# Patient Record
Sex: Male | Born: 1945
Health system: Southern US, Community
[De-identification: ages and names within clinical notes are randomized; demographics above are authoritative.]

## PROBLEM LIST (undated history)

## (undated) DIAGNOSIS — E785 Hyperlipidemia, unspecified: Secondary | ICD-10-CM

## (undated) DIAGNOSIS — J310 Chronic rhinitis: Secondary | ICD-10-CM

## (undated) DIAGNOSIS — Z85828 Personal history of other malignant neoplasm of skin: Secondary | ICD-10-CM

## (undated) HISTORY — PX: MOHS SURGERY: SUR867

## (undated) HISTORY — DX: Hyperlipidemia, unspecified: E78.5

## (undated) HISTORY — DX: Personal history of other malignant neoplasm of skin: Z85.828

## (undated) HISTORY — DX: Chronic rhinitis: J31.0

---

## 2018-07-09 ENCOUNTER — Ambulatory Visit (INDEPENDENT_AMBULATORY_CARE_PROVIDER_SITE_OTHER): Payer: Medicare Other | Admitting: Family Medicine

## 2018-07-09 ENCOUNTER — Other Ambulatory Visit: Payer: Self-pay

## 2018-07-09 ENCOUNTER — Encounter: Payer: Self-pay | Admitting: Family Medicine

## 2018-07-09 DIAGNOSIS — E785 Hyperlipidemia, unspecified: Secondary | ICD-10-CM | POA: Diagnosis not present

## 2018-07-09 DIAGNOSIS — Z85828 Personal history of other malignant neoplasm of skin: Secondary | ICD-10-CM | POA: Diagnosis not present

## 2018-07-09 MED ORDER — SIMVASTATIN 20 MG PO TABS: 20.0000 mg | ORAL_TABLET | Freq: Every day | ORAL | 3 refills | Status: DC

## 2018-07-09 NOTE — Progress Notes (Addendum)
Chief Complaint  Patient presents with  . New Patient (Initial Visit)       New Patient Visit SUBJECTIVE: HPI: Justin Escobar is an 73 y.o.male who is being seen for establishing care.  Seen in [place] previously.   Due to outbreak, we are interacting via telephone. Tried to set up a visit via web portal for an electronic face-to-face visit, but this was not done due to tech difficulties. I verified patient's ID using 2 identifiers.   Hx of skin cancer (BCC) for which he has bi-yearly checks from derm. Requesting referral. No current lesions of concern.  Hx of hyperlipidemia. Takes Zocor 20 mg/d, no AE's. Reports compliance.  Past Medical History:  Diagnosis Date  . History of basal cell carcinoma (BCC) of skin    Past Surgical History:  Procedure Laterality Date  . MOHS SURGERY     Family History  Problem Relation Age of Onset  . Alzheimer's disease Mother   . Cancer Neg Hx    Takes no meds routinely.  ROS Skin: Denies current skin lesions   OBJECTIVE: No conversational dyspnea Age appropriate judgment and insight Nml affect and mood  ASSESSMENT/PLAN: History of skin cancer - Plan: Ambulatory referral to Dermatology  Hyperlipidemia, unspecified hyperlipidemia type - Plan: simvastatin (ZOCOR) 20 MG tablet  Refills as above. Total time spent: 17 minutes Patient should return in 6 mo for med ck and likely will get some labs. The patient voiced understanding and agreement to the plan.   [place], DO 07/09/18  9:25 AM

## 2018-07-14 ENCOUNTER — Telehealth: Payer: Self-pay | Admitting: Family Medicine

## 2018-07-14 MED ORDER — SIMVASTATIN 40 MG PO TABS: 40.0000 mg | ORAL_TABLET | Freq: Every day | ORAL | 6 refills | Status: DC

## 2018-07-14 NOTE — Addendum Note (Signed)
Addended by: Sharon Seller B on: 07/14/2018 02:50 PM   Modules accepted: Orders

## 2018-07-14 NOTE — Telephone Encounter (Signed)
We did refer to derm. I thought he told us 20 mg, OK to send in what dose he was on. Ty.

## 2018-07-14 NOTE — Telephone Encounter (Signed)
Corrected the amount and sent in.

## 2018-07-14 NOTE — Telephone Encounter (Signed)
Patient called and stated that the wrong dose was called in for his simvastatin. He would like a call back about this and to discuss his referral to dermatology that was placed on 07/09/2018. Call back number is [phone] (mobile)

## 2018-11-07 ENCOUNTER — Ambulatory Visit (INDEPENDENT_AMBULATORY_CARE_PROVIDER_SITE_OTHER): Payer: Medicare Other | Admitting: Family Medicine

## 2018-11-07 ENCOUNTER — Other Ambulatory Visit: Payer: Self-pay

## 2018-11-07 ENCOUNTER — Encounter: Payer: Self-pay | Admitting: Family Medicine

## 2018-11-07 VITALS — BP 134/86 | HR 79 | Temp 98.0°F | Ht 73.0 in | Wt 190.1 lb

## 2018-11-07 DIAGNOSIS — N50811 Right testicular pain: Secondary | ICD-10-CM | POA: Diagnosis not present

## 2018-11-07 DIAGNOSIS — N509 Disorder of male genital organs, unspecified: Secondary | ICD-10-CM

## 2018-11-07 NOTE — Patient Instructions (Addendum)
If you do not hear anything about your referral in the next 1-2 weeks, call our office and ask for an update.  Ice/cold pack over area for 10-15 min twice daily.  OK to take Tylenol 1000 mg (2 extra strength tabs) or 975 mg (3 regular strength tabs) every 6 hours as needed.  Let us know if you need anything.  

## 2018-11-07 NOTE — Progress Notes (Signed)
Chief Complaint  Patient presents with  . Testicle Pain  . Referral    Subjective: Patient is a 73 y.o. male here for testicular pain.  R sided testicular pain that started around 3 mo ago. No recent injury or change in activity. Happened around 1 year ago before moving, had [name] that showed cyst but no other issues. It had gone away randomly. Comes and goes in general. Wears briefs. Walks, does not run or cycle. No hx of antibiotics.   ROS: GU: As noted in HPI  Past Medical History:  Diagnosis Date  . History of basal cell carcinoma (BCC) of skin     Objective: BP 134/86 (BP Location: Left Arm, Patient Position: Sitting, Cuff Size: Normal)   Pulse 79   Temp 98 F (36.7 C) (Temporal)   Ht 6\' 1"  (1.854 m)   Wt 190 lb 2 oz (86.2 kg)   SpO2 97%   BMI 25.08 kg/m  General: Awake, appears stated age GU: Mild ttp over epididymis. No current pain. On the scrotum, there is a dome shaped lesion approximately 1.5 cm in diameter, there is a central excoriation. No other erythema or fluctuance.  Heart: RRR Abd: S, NT, ND, BS+ Lungs: CTAB, no rales, wheezes or rhonchi. No accessory muscle use Psych: Age appropriate judgment and insight, normal affect and mood  Assessment and Plan: Testicular pain, right - Plan: Ambulatory referral to Urology, question epididymitis.   Scrotal skin lesion - Plan: Ambulatory referral to Urology, concern for [facility], will biopsy if urology defers.   The patient voiced understanding and agreement to the plan.  [place], DO 11/07/18  3:23 PM

## 2019-01-12 ENCOUNTER — Other Ambulatory Visit: Payer: Self-pay

## 2019-01-13 ENCOUNTER — Ambulatory Visit (INDEPENDENT_AMBULATORY_CARE_PROVIDER_SITE_OTHER): Payer: Medicare Other | Admitting: Family Medicine

## 2019-01-13 ENCOUNTER — Encounter: Payer: Self-pay | Admitting: Family Medicine

## 2019-01-13 VITALS — BP 120/72 | HR 83 | Temp 97.1°F | Ht 73.0 in | Wt 189.0 lb

## 2019-01-13 DIAGNOSIS — J3489 Other specified disorders of nose and nasal sinuses: Secondary | ICD-10-CM | POA: Diagnosis not present

## 2019-01-13 DIAGNOSIS — E785 Hyperlipidemia, unspecified: Secondary | ICD-10-CM | POA: Diagnosis not present

## 2019-01-13 DIAGNOSIS — Z1159 Encounter for screening for other viral diseases: Secondary | ICD-10-CM

## 2019-01-13 DIAGNOSIS — E538 Deficiency of other specified B group vitamins: Secondary | ICD-10-CM | POA: Insufficient documentation

## 2019-01-13 LAB — COMPREHENSIVE METABOLIC PANEL
ALT: 27 U/L (ref 0–53)
AST: 25 U/L (ref 0–37)
Albumin: 4.9 g/dL (ref 3.5–5.2)
Alkaline Phosphatase: 66 U/L (ref 39–117)
BUN: 13 mg/dL (ref 6–23)
CO2: 30 mEq/L (ref 19–32)
Calcium: 9.8 mg/dL (ref 8.4–10.5)
Chloride: 103 mEq/L (ref 96–112)
Creatinine, Ser: 0.96 mg/dL (ref 0.40–1.50)
GFR: 76.67 mL/min (ref >60.00)
Glucose, Bld: 97 mg/dL (ref 70–99)
Potassium: 4.6 mEq/L (ref 3.5–5.1)
Sodium: 139 mEq/L (ref 135–145)
Total Bilirubin: 0.9 mg/dL (ref 0.2–1.2)
Total Protein: 7 g/dL (ref 6.0–8.3)

## 2019-01-13 LAB — LIPID PANEL
Cholesterol: 131 mg/dL (ref 0–200)
HDL: 55.2 mg/dL (ref >39.00)
LDL Cholesterol: 51 mg/dL (ref 0–99)
NonHDL: 75.77
Total CHOL/HDL Ratio: 2
Triglycerides: 124 mg/dL (ref 0.0–149.0)
VLDL: 24.8 mg/dL (ref 0.0–40.0)

## 2019-01-13 LAB — VITAMIN B12: Vitamin B-12: 341 pg/mL (ref 211–911)

## 2019-01-13 MED ORDER — LEVOCETIRIZINE DIHYDROCHLORIDE 5 MG PO TABS: 5.0000 mg | ORAL_TABLET | Freq: Every evening | ORAL | 2 refills | Status: DC

## 2019-01-13 NOTE — Patient Instructions (Addendum)
Give us 2-3 business days to get the results of your labs back.   Keep the diet clean and stay active.  Claritin (loratadine), Allegra (fexofenadine), Zyrtec (cetirizine) which is also equivalent to Xyzal (levocetirizine); these are listed in order from weakest to strongest. Generic, and therefore cheaper, options are in the parentheses.   Flonase (fluticasone); nasal spray that is over the counter. 2 sprays each nostril, once daily. Aim towards the same side eye when you spray.  There are available OTC, and the generic versions, which may be cheaper, are in parentheses. Show this to a pharmacist if you have trouble finding any of these items.  Let us know if you need anything. 

## 2019-01-13 NOTE — Progress Notes (Signed)
Chief Complaint  Patient presents with  . Follow-up    Subjective: Hyperlipidemia Patient presents for Hyperlipidemia follow up. Currently taking simvastatin 40 mg daily and compliance with treatment thus far has been good. He denies myalgias. He is adhering to a healthy diet. Exercise: Walking The patient is not known to have coexisting coronary artery disease.  Has had a runny nose for many years.  Saw an allergist and found out he is not allergic to very much.  Has not tried any medication for this.  Usually on the right side will have a runny nose and teary-eyed.  Denies any itching or swelling.  He has a history of low vitamin B12.  He used to be on oral supplementation but is not currently on anything.  ROS: Heart: Denies chest pain Lungs: Denies SOB   Past Medical History:  Diagnosis Date  . History of basal cell carcinoma (BCC) of skin     Objective: BP 120/72 (BP Location: Left Arm, Patient Position: Sitting, Cuff Size: Normal)   Pulse 83   Temp (!) 97.1 F (36.2 C) (Temporal)   Ht 6\' 1"  (1.854 m)   Wt 189 lb (85.7 kg)   SpO2 96%   BMI 24.94 kg/m  General: Awake, appears stated age HEENT: MMM, nares patent w no current rhinorrhea, ears neg, eyes clear without drainage or crusting Heart: RRR, no LE edema, no bruits Lungs: CTAB, no rales, wheezes or rhonchi. No accessory muscle use Psych: Age appropriate judgment and insight, normal affect and mood  Assessment and Plan: Hyperlipidemia, unspecified hyperlipidemia type - Plan: Lipid panel, Comprehensive metabolic panel  Low serum vitamin B12 - Plan: B12  Encounter for hepatitis C screening test for low risk patient - Plan: Hepatitis C antibody  Rhinorrhea - Plan: levocetirizine (XYZAL) 5 MG tablet  1-check labs, continue Zocor, counseled on diet and exercise 2-check B12 level, might not need to take any supplementation 3-check 4-trial oral antihistamine. F/u in 1 year for med check. The patient voiced  understanding and agreement to the plan.  [place], DO 01/13/19  12:07 PM

## 2019-01-14 LAB — HEPATITIS C ANTIBODY
Hepatitis C Ab: NONREACTIVE
SIGNAL TO CUT-OFF: 0.01 (ref <1.00)

## 2019-03-05 ENCOUNTER — Telehealth: Payer: Self-pay | Admitting: Family Medicine

## 2019-03-05 NOTE — Telephone Encounter (Signed)
Has patient seen PCP for this complaint? No and unsure *If NO, is insurance requiring patient see PCP for this issue before PCP can refer them? Referral for which specialty: Podiatrist Preferred provider/office: office on [address], pt unsure of office.  Reason for referral: pt states left foot has been causing him pain to walk.

## 2019-03-09 NOTE — Telephone Encounter (Signed)
appt scheduled tomorrow 03/10/19 in the morning.

## 2019-03-09 NOTE — Telephone Encounter (Signed)
Sched him w me tomorrow so I can take a look first. Might not need referral. Ty.

## 2019-03-10 ENCOUNTER — Other Ambulatory Visit: Payer: Self-pay | Admitting: Family Medicine

## 2019-03-10 ENCOUNTER — Other Ambulatory Visit: Payer: Self-pay

## 2019-03-10 ENCOUNTER — Encounter: Payer: Self-pay | Admitting: Family Medicine

## 2019-03-10 ENCOUNTER — Ambulatory Visit (INDEPENDENT_AMBULATORY_CARE_PROVIDER_SITE_OTHER): Payer: Medicare Other | Admitting: Family Medicine

## 2019-03-10 ENCOUNTER — Ambulatory Visit (HOSPITAL_BASED_OUTPATIENT_CLINIC_OR_DEPARTMENT_OTHER)
Admission: RE | Admit: 2019-03-10 | Discharge: 2019-03-10 | Disposition: A | Discharge status: Home / Self Care | Payer: Medicare Other | Source: Ambulatory Visit | Attending: Family Medicine | Admitting: Family Medicine

## 2019-03-10 VITALS — BP 130/70 | HR 73 | Temp 96.1°F | Ht 73.0 in | Wt 193.0 lb

## 2019-03-10 DIAGNOSIS — M19079 Primary osteoarthritis, unspecified ankle and foot: Secondary | ICD-10-CM

## 2019-03-10 DIAGNOSIS — M79672 Pain in left foot: Secondary | ICD-10-CM | POA: Diagnosis not present

## 2019-03-10 NOTE — Progress Notes (Signed)
Musculoskeletal Exam  Patient: Justin Escobar DOB: 09-04-1945  DOS: 03/10/2019  SUBJECTIVE:  Chief Complaint:   Chief Complaint  Patient presents with  . Foot Pain    left    Justin Escobar is a 73 y.o.  male for evaluation and treatment of L foot pain.   Onset:  3 years ago. Was chasing dog and tripped; had bruising and swelling initially Location: ball of feet Character:  shooting  Progression of issue:  Has gotten worse over past year Associated symptoms: worse when he walks Treatment: to date has been none.   Neurovascular symptoms: no  ROS: Musculoskeletal/Extremities: +L foot pain  Past Medical History:  Diagnosis Date  . History of basal cell carcinoma (BCC) of skin     Objective: VITAL SIGNS: BP 130/70 (BP Location: Left Arm, Patient Position: Sitting, Cuff Size: Normal)   Pulse 73   Temp (!) 96.1 F (35.6 C) (Temporal)   Ht 6\' 1"  (1.854 m)   Wt 193 lb (87.5 kg)   SpO2 97%   BMI 25.46 kg/m  Constitutional: Well formed, well developed. No acute distress. Cardiovascular: Brisk cap refill Thorax & Lungs: No accessory muscle use Musculoskeletal: L foot.   Tenderness to palpation: yes, over 1st plantar MTP and MT head Deformity: no Ecchymosis: no No erythema or excessive warmth Neurologic: Normal sensory function. Psychiatric: Normal mood. Age appropriate judgment and insight. Alert & oriented x 3.    Assessment:  Left foot pain - Plan: DG Toe Great Left  Plan: Ck xr. MT pads. If no improvement, will refer to podiatry for possible injection.  F/u prn. The patient voiced understanding and agreement to the plan.   [place], DO 03/10/19  12:04 PM

## 2019-03-10 NOTE — Patient Instructions (Signed)
Add metatarsal pads to your shoes for walking.   Ice/cold pack over area for 10-15 min twice daily.  Activity as tolerated.  We will be in touch regarding your X-ray results and what the next steps.   Let us know if you need anything.

## 2019-03-16 ENCOUNTER — Other Ambulatory Visit: Payer: Self-pay

## 2019-03-16 ENCOUNTER — Encounter: Payer: Self-pay | Admitting: Orthopedic Surgery

## 2019-03-16 ENCOUNTER — Ambulatory Visit (INDEPENDENT_AMBULATORY_CARE_PROVIDER_SITE_OTHER): Payer: Medicare Other | Admitting: Orthopedic Surgery

## 2019-03-16 DIAGNOSIS — M79672 Pain in left foot: Secondary | ICD-10-CM

## 2019-03-16 NOTE — Progress Notes (Signed)
   Office Visit Note   Patient: Justin Escobar           Date of Birth: 1946-03-06           MRN: [id number] Visit Date: 03/16/2019              Requested by: Shelda Pal, [address] [place],  [place] [address] PCP: Shelda Pal, DO  Chief Complaint  Patient presents with  . Left Ankle - Pain  . Left Foot - Pain      HPI: Pleasant gentleman with a history of left foot great toe pain. No injury. He recently moved here and notices the pain when he is hiking on uneven ground  Assessment & Plan: Visit Diagnoses: No diagnosis found.  Plan: treatment options were discussed with him.  Dr. Sharol Given discussed with him conservative treatment including stiff shoes and doing nothing..  Alternatively he would do quite well with a left first MTP arthrodesis.  The surgery recovery and expectations and risks were discussed with him he would like to schedule this for January  Follow-Up Instructions: No follow-ups on file.   Ortho Exam  Patient is alert, oriented, no adenopathy, well-dressed, normal affect, normal respiratory effort. Left foot: Well-maintained alignment no swelling he has only 5 degrees of extension and about 10 degrees of flexion of the first MTP joint Previous x-rays were reviewed which shows loss and collapse of joint space of the first MTP joint of the left with associated osteophytes Imaging: No results found. No images are attached to the encounter.  Labs: No results found for: HGBA1C, ESRSEDRATE, CRP, LABURIC, REPTSTATUS, GRAMSTAIN, CULT, LABORGA   Lab Results  Component Value Date   ALBUMIN 4.9 01/13/2019    No results found for: MG No results found for: VD25OH  No results found for: PREALBUMIN No flowsheet data found.   There is no height or weight on file to calculate BMI.  Orders:  No orders of the defined types were placed in this encounter.  No orders of the defined types were placed in this encounter.    Procedures: No procedures performed  Clinical Data: No additional findings.  ROS:  All other systems negative, except as noted in the HPI. Review of Systems  Objective: Vital Signs: There were no vitals taken for this visit.  Specialty Comments:  No specialty comments available.  PMFS History: Patient Active Problem List   Diagnosis Date Noted  . Low serum vitamin B12 01/13/2019  . History of skin cancer 07/09/2018  . Hyperlipidemia 07/09/2018   Past Medical History:  Diagnosis Date  . History of basal cell carcinoma (BCC) of skin     Family History  Problem Relation Age of Onset  . Alzheimer's disease Mother   . Cancer Neg Hx     Past Surgical History:  Procedure Laterality Date  . MOHS SURGERY     Social History   Occupational History  . Not on file  Tobacco Use  . Smoking status: Former Research scientist (life sciences)  . Smokeless tobacco: Never Used  Substance and Sexual Activity  . Alcohol use: Yes  . Drug use: Never  . Sexual activity: Not on file

## 2019-03-30 ENCOUNTER — Other Ambulatory Visit: Payer: Self-pay | Admitting: Family Medicine

## 2019-03-31 DIAGNOSIS — Z85828 Personal history of other malignant neoplasm of skin: Secondary | ICD-10-CM | POA: Diagnosis not present

## 2019-03-31 DIAGNOSIS — D225 Melanocytic nevi of trunk: Secondary | ICD-10-CM | POA: Diagnosis not present

## 2019-03-31 DIAGNOSIS — L570 Actinic keratosis: Secondary | ICD-10-CM | POA: Diagnosis not present

## 2019-04-19 ENCOUNTER — Ambulatory Visit: Payer: Medicare HMO | Attending: Internal Medicine

## 2019-04-19 DIAGNOSIS — Z23 Encounter for immunization: Secondary | ICD-10-CM | POA: Insufficient documentation

## 2019-04-19 NOTE — Progress Notes (Signed)
   Covid-19 Vaccination Clinic  Name:  Justin Escobar    MRN: [id number] DOB: 01-Jul-1945  04/19/2019  [name] was observed post Covid-19 immunization for 15 minutes without incidence. He was provided with Vaccine Information Sheet and instruction to access the V-Safe system.   [name] was instructed to call 911 with any severe reactions post vaccine: Marland Kitchen Difficulty breathing  . Swelling of your face and throat  . A fast heartbeat  . A bad rash all over your body  . Dizziness and weakness    Immunizations Administered    Name Date Dose VIS Date Route   Pfizer COVID-19 Vaccine 04/19/2019 10:52 AM 0.3 mL 03/06/2019 Intramuscular   Manufacturer: [place]   Lot: [id number]   [place]: [id number]

## 2019-04-21 ENCOUNTER — Telehealth: Payer: Self-pay | Admitting: Orthopedic Surgery

## 2019-04-21 NOTE — Telephone Encounter (Signed)
Patient called.   He is scheduled for outpatient surgery on the 16th and has his second round on the vaccine on the 15th. He needs advisement   Call back number: [phone]

## 2019-04-22 NOTE — Telephone Encounter (Signed)
Patient was called and informed that he can still receive his vaccination with no issue and this will not affect his surgery. He also wanted to be informed of getting a boot instead of post-op shoe on the date of discharge and was advised that he needs will be met upon that date. Patient understood. 

## 2019-04-23 ENCOUNTER — Other Ambulatory Visit: Payer: Self-pay

## 2019-04-23 ENCOUNTER — Telehealth: Payer: Self-pay | Admitting: Orthopedic Surgery

## 2019-04-23 NOTE — Telephone Encounter (Signed)
Patient was scheduled for MTP fusion at [facility] on 05-12-19 with Dr. Sharol Given, but was rescheduled to 05-19-19.  He had the 1st Covid vaccine on 04-19-19 and is scheduled to have 2nd Covid vaccine on 05-11-19. He wants to know if this will cause any issue with test results or delaying his surgery.       Pt's cb  [phone]

## 2019-04-23 NOTE — Telephone Encounter (Signed)
I have spoken to patient and ensured him that his vaccine will not affect him from proceeding with his surgery.

## 2019-04-30 ENCOUNTER — Other Ambulatory Visit: Payer: Self-pay | Admitting: Physician Assistant

## 2019-05-01 ENCOUNTER — Other Ambulatory Visit: Payer: Self-pay | Admitting: Family Medicine

## 2019-05-04 ENCOUNTER — Other Ambulatory Visit: Payer: Self-pay | Admitting: Family Medicine

## 2019-05-04 MED ORDER — SIMVASTATIN 40 MG PO TABS: 40.0000 mg | ORAL_TABLET | Freq: Every day | ORAL | 0 refills | Status: DC

## 2019-05-11 ENCOUNTER — Ambulatory Visit: Payer: Medicare HMO | Attending: Internal Medicine

## 2019-05-11 DIAGNOSIS — Z23 Encounter for immunization: Secondary | ICD-10-CM | POA: Insufficient documentation

## 2019-05-11 NOTE — Progress Notes (Signed)
   Covid-19 Vaccination Clinic  Name:  Justin Escobar    MRN: [id number] DOB: 04/11/1945  05/11/2019  Mr. Sweet was observed post Covid-19 immunization for 15 minutes without incidence. He was provided with Vaccine Information Sheet and instruction to access the V-Safe system.   [name] was instructed to call 911 with any severe reactions post vaccine: Marland Kitchen Difficulty breathing  . Swelling of your face and throat  . A fast heartbeat  . A bad rash all over your body  . Dizziness and weakness    Immunizations Administered    Name Date Dose VIS Date Route   Pfizer COVID-19 Vaccine 05/11/2019  8:36 AM 0.3 mL 03/06/2019 Intramuscular   Manufacturer: [place]   Lot: [id number]   [place]: [id number]

## 2019-05-12 ENCOUNTER — Encounter (HOSPITAL_BASED_OUTPATIENT_CLINIC_OR_DEPARTMENT_OTHER): Payer: Self-pay | Admitting: Orthopedic Surgery

## 2019-05-12 ENCOUNTER — Other Ambulatory Visit: Payer: Self-pay

## 2019-05-15 ENCOUNTER — Other Ambulatory Visit (HOSPITAL_COMMUNITY)
Admission: RE | Admit: 2019-05-15 | Discharge: 2019-05-15 | Disposition: A | Discharge status: Home / Self Care | Payer: Medicare HMO | Source: Ambulatory Visit | Attending: Orthopedic Surgery | Admitting: Orthopedic Surgery

## 2019-05-15 DIAGNOSIS — Z01812 Encounter for preprocedural laboratory examination: Secondary | ICD-10-CM | POA: Insufficient documentation

## 2019-05-15 DIAGNOSIS — Z20822 Contact with and (suspected) exposure to covid-19: Secondary | ICD-10-CM | POA: Insufficient documentation

## 2019-05-15 LAB — SARS CORONAVIRUS 2 (TAT 6-24 HRS): SARS Coronavirus 2: NEGATIVE

## 2019-05-15 NOTE — Progress Notes (Signed)

## 2019-05-18 NOTE — Anesthesia Preprocedure Evaluation (Addendum)
Anesthesia Evaluation  Patient identified by MRN, date of birth, ID band Patient awake    Reviewed: Allergy & Precautions, NPO status , Patient's Chart, lab work & pertinent test results  History of Anesthesia Complications Negative for: history of anesthetic complications  Airway Mallampati: II  TM Distance: >3 FB Neck ROM: Full    Dental no notable dental hx.    Pulmonary former smoker,    Pulmonary exam normal        Cardiovascular negative cardio ROS Normal cardiovascular exam     Neuro/Psych negative neurological ROS  negative psych ROS   GI/Hepatic negative GI ROS, Neg liver ROS,   Endo/Other  negative endocrine ROS  Renal/GU negative Renal ROS  negative genitourinary   Musculoskeletal Left Foot Hallux Rigidus   Abdominal   Peds  Hematology negative hematology ROS (+)   Anesthesia Other Findings Day of surgery medications reviewed with patient.  Reproductive/Obstetrics negative OB ROS                            Anesthesia Physical Anesthesia Plan  ASA: I  Anesthesia Plan: Regional and MAC   Post-op Pain Management:    Induction:   PONV Risk Score and Plan: Treatment may vary due to age or medical condition and Propofol infusion  Airway Management Planned: Natural Airway and Simple Face Mask  Additional Equipment: None  Intra-op Plan:   Post-operative Plan:   Informed Consent: I have reviewed the patients History and Physical, chart, labs and discussed the procedure including the risks, benefits and alternatives for the proposed anesthesia with the patient or authorized representative who has indicated his/her understanding and acceptance.       Plan Discussed with: CRNA  Anesthesia Plan Comments:        Anesthesia Quick Evaluation

## 2019-05-19 ENCOUNTER — Other Ambulatory Visit: Payer: Self-pay

## 2019-05-19 ENCOUNTER — Ambulatory Visit (HOSPITAL_BASED_OUTPATIENT_CLINIC_OR_DEPARTMENT_OTHER): Payer: Medicare HMO | Admitting: Anesthesiology

## 2019-05-19 ENCOUNTER — Encounter (HOSPITAL_BASED_OUTPATIENT_CLINIC_OR_DEPARTMENT_OTHER)
Admission: RE | Disposition: A | Discharge status: Home / Self Care | Payer: Self-pay | Source: Ambulatory Visit | Attending: Orthopedic Surgery

## 2019-05-19 ENCOUNTER — Telehealth: Payer: Self-pay

## 2019-05-19 ENCOUNTER — Telehealth: Payer: Self-pay | Admitting: Orthopedic Surgery

## 2019-05-19 ENCOUNTER — Encounter (HOSPITAL_BASED_OUTPATIENT_CLINIC_OR_DEPARTMENT_OTHER): Payer: Self-pay | Admitting: Orthopedic Surgery

## 2019-05-19 ENCOUNTER — Ambulatory Visit (HOSPITAL_BASED_OUTPATIENT_CLINIC_OR_DEPARTMENT_OTHER)
Admission: RE | Admit: 2019-05-19 | Discharge: 2019-05-19 | Disposition: A | Discharge status: Home / Self Care | Payer: Medicare HMO | Source: Ambulatory Visit | Attending: Orthopedic Surgery | Admitting: Orthopedic Surgery

## 2019-05-19 DIAGNOSIS — Z79899 Other long term (current) drug therapy: Secondary | ICD-10-CM | POA: Insufficient documentation

## 2019-05-19 DIAGNOSIS — Z91040 Latex allergy status: Secondary | ICD-10-CM | POA: Insufficient documentation

## 2019-05-19 DIAGNOSIS — Z85828 Personal history of other malignant neoplasm of skin: Secondary | ICD-10-CM | POA: Diagnosis not present

## 2019-05-19 DIAGNOSIS — M2022 Hallux rigidus, left foot: Secondary | ICD-10-CM | POA: Diagnosis not present

## 2019-05-19 DIAGNOSIS — Z87891 Personal history of nicotine dependence: Secondary | ICD-10-CM | POA: Diagnosis not present

## 2019-05-19 DIAGNOSIS — Z820 Family history of epilepsy and other diseases of the nervous system: Secondary | ICD-10-CM | POA: Diagnosis not present

## 2019-05-19 DIAGNOSIS — E785 Hyperlipidemia, unspecified: Secondary | ICD-10-CM | POA: Diagnosis not present

## 2019-05-19 HISTORY — PX: ARTHRODESIS METATARSALPHALANGEAL JOINT (MTPJ): SHX6566

## 2019-05-19 SURGERY — FUSION, JOINT, GREAT TOE
Anesthesia: Monitor Anesthesia Care | Site: Toe | Laterality: Left

## 2019-05-19 MED ORDER — FENTANYL CITRATE (PF) 100 MCG/2ML IJ SOLN: 25.0000 ug | INTRAMUSCULAR | Status: DC | PRN

## 2019-05-19 MED ORDER — FENTANYL CITRATE (PF) 100 MCG/2ML IJ SOLN
50.0000 ug | INTRAMUSCULAR | Status: DC | PRN
  Administered 2019-05-19: 100 ug via INTRAVENOUS

## 2019-05-19 MED ORDER — BUPIVACAINE HCL 0.5 % IJ SOLN
INTRAMUSCULAR | Status: DC | PRN
  Administered 2019-05-19: 28 mL

## 2019-05-19 MED ORDER — ONDANSETRON HCL 4 MG/2ML IJ SOLN
INTRAMUSCULAR | Status: DC | PRN
  Administered 2019-05-19: 4 mg via INTRAVENOUS

## 2019-05-19 MED ORDER — ACETAMINOPHEN 500 MG PO TABS
ORAL_TABLET | ORAL | Status: AC
  Filled 2019-05-19: qty 2

## 2019-05-19 MED ORDER — ACETAMINOPHEN 500 MG PO TABS
1000.0000 mg | ORAL_TABLET | Freq: Once | ORAL | Status: AC
  Administered 2019-05-19: 08:00:00 1000 mg via ORAL

## 2019-05-19 MED ORDER — HYDROCODONE-ACETAMINOPHEN 5-325 MG PO TABS: 1.0000 | ORAL_TABLET | ORAL | 0 refills | Status: DC | PRN

## 2019-05-19 MED ORDER — PROMETHAZINE HCL 25 MG/ML IJ SOLN: 6.2500 mg | INTRAMUSCULAR | Status: DC | PRN

## 2019-05-19 MED ORDER — CHLORHEXIDINE GLUCONATE 4 % EX LIQD: 60.0000 mL | Freq: Once | CUTANEOUS | Status: DC

## 2019-05-19 MED ORDER — GLYCOPYRROLATE 0.2 MG/ML IJ SOLN
INTRAMUSCULAR | Status: AC
  Filled 2019-05-19: qty 1

## 2019-05-19 MED ORDER — PROPOFOL 10 MG/ML IV BOLUS
INTRAVENOUS | Status: DC | PRN
  Administered 2019-05-19: 20 mg via INTRAVENOUS

## 2019-05-19 MED ORDER — OXYCODONE HCL 5 MG PO TABS: 5.0000 mg | ORAL_TABLET | Freq: Once | ORAL | Status: DC | PRN

## 2019-05-19 MED ORDER — MIDAZOLAM HCL 2 MG/2ML IJ SOLN: 1.0000 mg | INTRAMUSCULAR | Status: DC | PRN

## 2019-05-19 MED ORDER — MIDAZOLAM HCL 2 MG/2ML IJ SOLN
INTRAMUSCULAR | Status: AC
  Filled 2019-05-19: qty 2

## 2019-05-19 MED ORDER — OXYCODONE HCL 5 MG/5ML PO SOLN: 5.0000 mg | Freq: Once | ORAL | Status: DC | PRN

## 2019-05-19 MED ORDER — PROPOFOL 500 MG/50ML IV EMUL
INTRAVENOUS | Status: DC | PRN
  Administered 2019-05-19: 50 ug/kg/min via INTRAVENOUS

## 2019-05-19 MED ORDER — 0.9 % SODIUM CHLORIDE (POUR BTL) OPTIME
TOPICAL | Status: DC | PRN
  Administered 2019-05-19: 1000 mL

## 2019-05-19 MED ORDER — CEFAZOLIN SODIUM-DEXTROSE 2-4 GM/100ML-% IV SOLN
INTRAVENOUS | Status: AC
  Filled 2019-05-19: qty 100

## 2019-05-19 MED ORDER — LACTATED RINGERS IV SOLN: INTRAVENOUS | Status: DC

## 2019-05-19 MED ORDER — GLYCOPYRROLATE 0.2 MG/ML IJ SOLN
0.2000 mg | Freq: Once | INTRAMUSCULAR | Status: AC
  Administered 2019-05-19: 08:00:00 0.2 mg via INTRAVENOUS

## 2019-05-19 MED ORDER — CEFAZOLIN SODIUM-DEXTROSE 2-4 GM/100ML-% IV SOLN
2.0000 g | INTRAVENOUS | Status: AC
  Administered 2019-05-19: 2 g via INTRAVENOUS

## 2019-05-19 MED ORDER — FENTANYL CITRATE (PF) 100 MCG/2ML IJ SOLN
INTRAMUSCULAR | Status: AC
  Filled 2019-05-19: qty 2

## 2019-05-19 SURGICAL SUPPLY — 47 items
BIT DRILL LCP QC 2X140 (BIT) ×1 IMPLANT
BLADE OSC/SAG .038X5.5 CUT EDG (BLADE) ×1 IMPLANT
BLADE SURG 15 STRL LF DISP TIS (BLADE) ×2 IMPLANT
BLADE SURG 15 STRL SS (BLADE) ×2
BNDG COHESIVE 4X5 TAN STRL (GAUZE/BANDAGES/DRESSINGS) ×2 IMPLANT
BNDG ESMARK 4X9 LF (GAUZE/BANDAGES/DRESSINGS) ×2 IMPLANT
BNDG GAUZE ELAST 4 BULKY (GAUZE/BANDAGES/DRESSINGS) ×2 IMPLANT
COVER BACK TABLE 60X90IN (DRAPES) ×2 IMPLANT
COVER WAND RF STERILE (DRAPES) IMPLANT
DECANTER SPIKE VIAL GLASS SM (MISCELLANEOUS) IMPLANT
DRAPE EXTREMITY T 121X128X90 (DISPOSABLE) ×2 IMPLANT
DRAPE OEC MINIVIEW 54X84 (DRAPES) ×2 IMPLANT
DRAPE U-SHAPE 47X51 STRL (DRAPES) IMPLANT
DRSG EMULSION OIL 3X3 NADH (GAUZE/BANDAGES/DRESSINGS) ×2 IMPLANT
DURAPREP 26ML APPLICATOR (WOUND CARE) ×2 IMPLANT
ELECT REM PT RETURN 9FT ADLT (ELECTROSURGICAL) ×2
ELECTRODE REM PT RTRN 9FT ADLT (ELECTROSURGICAL) ×1 IMPLANT
GAUZE 4X4 16PLY RFD (DISPOSABLE) IMPLANT
GAUZE SPONGE 4X4 12PLY STRL (GAUZE/BANDAGES/DRESSINGS) ×2 IMPLANT
GLOVE BIOGEL PI IND STRL 9 (GLOVE) ×1 IMPLANT
GLOVE BIOGEL PI INDICATOR 9 (GLOVE) ×1
GLOVE SURG ORTHO 9.0 STRL STRW (GLOVE) ×2 IMPLANT
GOWN STRL REUS W/ TWL LRG LVL3 (GOWN DISPOSABLE) ×1 IMPLANT
GOWN STRL REUS W/TWL LRG LVL3 (GOWN DISPOSABLE) ×1
GOWN STRL REUS W/TWL XL LVL3 (GOWN DISPOSABLE) ×2 IMPLANT
NDL HYPO 25X1 1.5 SAFETY (NEEDLE) IMPLANT
NEEDLE HYPO 25X1 1.5 SAFETY (NEEDLE) IMPLANT
NS IRRIG 1000ML POUR BTL (IV SOLUTION) ×2 IMPLANT
PACK BASIN DAY SURGERY FS (CUSTOM PROCEDURE TRAY) ×2 IMPLANT
PAD CAST 4YDX4 CTTN HI CHSV (CAST SUPPLIES) ×1 IMPLANT
PADDING CAST COTTON 4X4 STRL (CAST SUPPLIES) ×1
PENCIL SMOKE EVACUATOR (MISCELLANEOUS) IMPLANT
PLATE LOCK VA-LCP 2.7X42 (Plate) ×1 IMPLANT
SCREW CORTEX 2.7 SLF-TPNG 18MM (Screw) ×1 IMPLANT
SCREW LOCK VA ST 2.7X14 (Screw) ×2 IMPLANT
SCREW LOCKING 2.7X16MM VA (Screw) ×3 IMPLANT
SCREW SELF TAP 14MM (Screw) ×1 IMPLANT
SPONGE LAP 18X18 RF (DISPOSABLE) ×2 IMPLANT
STOCKINETTE 6  STRL (DRAPES) ×1
STOCKINETTE 6 STRL (DRAPES) ×1 IMPLANT
SUT ETHILON 2 0 FSLX (SUTURE) ×2 IMPLANT
SUT ETHILON 3 0 FSL (SUTURE) ×2 IMPLANT
SUT VIC AB 2-0 CT1 27 (SUTURE)
SUT VIC AB 2-0 CT1 TAPERPNT 27 (SUTURE) IMPLANT
SYR BULB 3OZ (MISCELLANEOUS) ×2 IMPLANT
SYR CONTROL 10ML LL (SYRINGE) IMPLANT
TOWEL GREEN STERILE FF (TOWEL DISPOSABLE) ×2 IMPLANT

## 2019-05-19 NOTE — Anesthesia Postprocedure Evaluation (Signed)
Anesthesia Post Note  Patient: Justin Escobar  Procedure(s) Performed: LEFT 1ST METATARSALPHALANGEAL JOINT FUSION (Left Toe)     Patient location during evaluation: PACU Anesthesia Type: Regional Level of consciousness: awake and alert and oriented Pain management: pain level controlled Vital Signs Assessment: post-procedure vital signs reviewed and stable Respiratory status: spontaneous breathing, nonlabored ventilation and respiratory function stable Cardiovascular status: blood pressure returned to baseline Postop Assessment: no apparent nausea or vomiting Anesthetic complications: no    Last Vitals:  Vitals:   05/19/19 1000 05/19/19 1015  BP: 104/66 106/73  Pulse: 67 61  Resp: (!) 21 16  Temp:    SpO2: 97% 98%    Last Pain:  Vitals:   05/19/19 1015  PainSc: 0-No pain    LLE Motor Response: Purposeful movement (05/19/19 1015) LLE Sensation: Numbness (05/19/19 1015)          Brennan Bailey

## 2019-05-19 NOTE — Op Note (Signed)
05/19/2019  9:30 AM  PATIENT:  Justin Escobar    PRE-OPERATIVE DIAGNOSIS:  Left Foot Hallux Rigidus  POST-OPERATIVE DIAGNOSIS:  Same  PROCEDURE:  LEFT 1ST METATARSALPHALANGEAL JOINT FUSION C-arm fluoroscopy to verify alignment.  SURGEON:  Newt Minion, MD  PHYSICIAN ASSISTANT:None ANESTHESIA:   General  PREOPERATIVE INDICATIONS:  [name] is a  74 y.o. male with a diagnosis of Left Foot Hallux Rigidus who failed conservative measures and elected for surgical management.    The risks benefits and alternatives were discussed with the patient preoperatively including but not limited to the risks of infection, bleeding, nerve injury, cardiopulmonary complications, the need for revision surgery, among others, and the patient was willing to proceed.  OPERATIVE IMPLANTS: 5 degrees Synthes fusion plate  <[id number]>_0<\/[id number]>_7 @  OPERATIVE FINDINGS: Hard sclerotic bone with large osteophytic bone spurs  OPERATIVE PROCEDURE: Patient was brought the operating room after undergoing an ankle block he then underwent a MAC anesthetic.  After adequate levels anesthesia were obtained patient's left lower extremity was prepped using DuraPrep draped into a sterile field a timeout was called.  A medial longitudinal incision was made over the MTP joint.  This was carried down through the retinaculum and the retinaculum was elevated.  There is large osteophytic bone spurs around the joint that were resected with a saw and rondure.  A guidewire was inserted down the shaft of the head of the first metatarsal and a 20 mm cup reamer was used to ream back to cancellous bone.  The saw and rondure were used to further debride around the metatarsal head.  Guidewire was then inserted down the shaft of the proximal phalanx.  The cone reamer 20 mm was used to ream back to bleeding viable subchondral bone.  Again there was sclerotic bone and this was scored to promote further bleeding.  The wound was irrigated with normal saline  joint was reduced the dorsal plate was applied this was secured proximally with a compression screw compressed a compression screw was placed distally.  2 locking screws were then placed proximally and the 2 compression screws were placed distally the compression screw was removed distally and replaced with a locking screw.  Wound was irrigated with normal saline C-arm fluoroscopy was used to verify alignment of both AP and lateral planes.  The wound was irrigated with normal saline the retinaculum was closed using 2-0 Vicryl skin was closed using 2-0 nylon sterile dressing was applied patient was taken the PACU in stable condition   DISCHARGE PLANNING:  Antibiotic duration: Preoperative antibiotics  Weightbearing: Nonweightbearing postoperative shoe  Pain medication: Vicodin  Dressing care/ Wound VAC: Follow-up in 1 week to change the dressing  Ambulatory devices:crutches  Discharge to: home  Follow-up: In the office 1 week post operative.

## 2019-05-19 NOTE — Discharge Instructions (Signed)
No Tylenol until 1:30 PM on 05/19/2019.     Post Anesthesia Home Care Instructions  Activity: Get plenty of rest for the remainder of the day. A responsible individual must stay with you for 24 hours following the procedure.  For the next 24 hours, DO NOT: -Drive a car -Paediatric nurse -Drink alcoholic beverages -Take any medication unless instructed by your physician -Make any legal decisions or sign important papers.  Meals: Start with liquid foods such as gelatin or soup. Progress to regular foods as tolerated. Avoid greasy, spicy, heavy foods. If nausea and/or vomiting occur, drink only clear liquids until the nausea and/or vomiting subsides. Call your physician if vomiting continues.  Special Instructions/Symptoms: Your throat may feel dry or sore from the anesthesia or the breathing tube placed in your throat during surgery. If this causes discomfort, gargle with warm salt water. The discomfort should disappear within 24 hours.  If you had a scopolamine patch placed behind your ear for the management of post- operative nausea and/or vomiting:  1. The medication in the patch is effective for 72 hours, after which it should be removed.  Wrap patch in a tissue and discard in the trash. Wash hands thoroughly with soap and water. 2. You may remove the patch earlier than 72 hours if you experience unpleasant side effects which may include dry mouth, dizziness or visual disturbances. 3. Avoid touching the patch. Wash your hands with soap and water after contact with the patch.        Regional Anesthesia Blocks  1. Numbness or the inability to move the "blocked" extremity may last from 3-48 hours after placement. The length of time depends on the medication injected and your individual response to the medication. If the numbness is not going away after 48 hours, call your surgeon.  2. The extremity that is blocked will need to be protected until the numbness is gone and the   Strength has returned. Because you cannot feel it, you will need to take extra care to avoid injury. Because it may be weak, you may have difficulty moving it or using it. You may not know what position it is in without looking at it while the block is in effect.  3. For blocks in the legs and feet, returning to weight bearing and walking needs to be done carefully. You will need to wait until the numbness is entirely gone and the strength has returned. You should be able to move your leg and foot normally before you try and bear weight or walk. You will need someone to be with you when you first try to ensure you do not fall and possibly risk injury.  4. Bruising and tenderness at the needle site are common side effects and will resolve in a few days.  5. Persistent numbness or new problems with movement should be communicated to the surgeon or the [place] [phone] [place] [phone]).

## 2019-05-19 NOTE — H&P (Signed)
Justin Escobar is an 74 y.o. male.   Chief Complaint: Pain left great toe MTP joint. HPI: Patient is a 74 year old gentleman who has pain with activities of daily living left great toe MTP joint.  Patient has tried conservative treatment including stiff soled shoes without relief.  Past Medical History:  Diagnosis Date  . History of basal cell carcinoma (BCC) of skin     Past Surgical History:  Procedure Laterality Date  . MOHS SURGERY      Family History  Problem Relation Age of Onset  . Alzheimer's disease Mother   . Cancer Neg Hx    Social History:  reports that he has quit smoking. He has never used smokeless tobacco. He reports current alcohol use. He reports that he does not use drugs.  Allergies:  Allergies  Allergen Reactions  . Latex Rash    Medications Prior to Admission  Medication Sig Dispense Refill  . simvastatin (ZOCOR) 40 MG tablet Take 1 tablet (40 mg total) by mouth daily. 90 tablet 0    No results found for this or any previous visit (from the past 48 hour(s)). No results found.  Review of Systems  All other systems reviewed and are negative.   Height 6\' 1"  (1.854 m), weight 84.8 kg. Physical Exam  Patient is alert and oriented no adenopathy well-dressed normal affect normal respiratory effort.  Examination left foot patient has a good dorsalis pedis pulse.  Patient only has 5 degrees of extension and 10 degrees of flexion of the great toe MTP joint there is pain to palpation over the joint pain with attempted range of motion.  Radiographs were reviewed which shows collapse of the joint space to the MTP joint with dorsal osteophytic bone spurs. Assessment/Plan Assessment: Hallux rigidus left great toe MTP joint.  Plan.  We will plan for left great toe MTP fusion.  Risk and benefits were discussed including infection neurovascular injury nonhealing the bone nonhealing the skin need for additional surgery.  Patient states he understands wished to proceed  at this time.  Newt Minion, MD 05/19/2019, 7:02 AM

## 2019-05-19 NOTE — Telephone Encounter (Signed)
[facility] called said they could not fill rx for norco written for 1-2 po q 4 hrs that the system would only let them fill for 1 po q 4.  I advised ok to change from 1-2 to 1

## 2019-05-19 NOTE — Telephone Encounter (Signed)
Noted  

## 2019-05-19 NOTE — Telephone Encounter (Signed)
Pharm has been called see other message.

## 2019-05-19 NOTE — Anesthesia Procedure Notes (Signed)
Anesthesia Regional Block: Ankle block   Pre-Anesthetic Checklist: ,, timeout performed, Correct Patient, Correct Site, Correct Laterality, Correct Procedure, Correct Position, site marked, Risks and benefits discussed, pre-op evaluation,  At surgeon's request and post-op pain management  Laterality: Left  Prep: Maximum Sterile Barrier Precautions used, chloraprep       Needles:  Injection technique: Single-shot  Needle Type: Echogenic Needle     Needle Length: 4cm  Needle Gauge: 25     Additional Needles:   Narrative:  Start time: 05/19/2019 7:56 AM End time: 05/19/2019 8:00 AM  Performed by: Personally  Anesthesiologist: Brennan Bailey, MD  Additional Notes: Risks, benefits, and alternative discussed. Patient gave consent for procedure. Patient prepped and draped in sterile fashion. Sedation administered, patient remains easily responsive to voice. Local anesthetic given in 5cc increments with no signs or symptoms of intravascular injection. No pain or paraesthesias with injection. Patient monitored throughout procedure with signs of LAST or immediate complications. Tolerated well.   Tawny Asal, MD

## 2019-05-19 NOTE — Telephone Encounter (Signed)
[facility] called and stated that they need to speak with someone right away about this patients pain medication  Please call them asap..  They are closed from 1-2

## 2019-05-19 NOTE — Progress Notes (Signed)
Assisted Dr. Daiva Huge with left, ultrasound guided, ankle block. Side rails up, monitors on throughout procedure. See vital signs in flow sheet. Tolerated Procedure well.

## 2019-05-19 NOTE — Transfer of Care (Signed)
Immediate Anesthesia Transfer of Care Note  Patient: Justin Escobar  Procedure(s) Performed: LEFT 1ST METATARSALPHALANGEAL JOINT FUSION (Left Toe)  Patient Location: PACU  Anesthesia Type:MAC and Regional  Level of Consciousness: awake, alert  and oriented  Airway & Oxygen Therapy: Patient Spontanous Breathing  Post-op Assessment: Report given to RN and Post -op Vital signs reviewed and stable  Post vital signs: Reviewed and stable  Last Vitals:  Vitals Value Taken Time  BP 97/71 05/19/19 0926  Temp    Pulse 64 05/19/19 0929  Resp 18 05/19/19 0929  SpO2 98 % 05/19/19 0929  Vitals shown include unvalidated device data.  Last Pain:  Vitals:   05/19/19 0729  PainSc: 0-No pain      Patients Stated Pain Goal: 3 (81/44/81 8563)  Complications: No apparent anesthesia complications

## 2019-05-21 ENCOUNTER — Encounter: Payer: Self-pay | Admitting: *Deleted

## 2019-05-22 ENCOUNTER — Ambulatory Visit: Payer: Medicare Other

## 2019-05-28 ENCOUNTER — Ambulatory Visit (INDEPENDENT_AMBULATORY_CARE_PROVIDER_SITE_OTHER): Payer: Medicare HMO | Admitting: Orthopedic Surgery

## 2019-05-28 ENCOUNTER — Other Ambulatory Visit: Payer: Self-pay

## 2019-05-28 ENCOUNTER — Encounter: Payer: Self-pay | Admitting: Orthopedic Surgery

## 2019-05-28 VITALS — Ht 73.0 in | Wt 192.0 lb

## 2019-05-28 DIAGNOSIS — M2022 Hallux rigidus, left foot: Secondary | ICD-10-CM

## 2019-05-28 NOTE — Progress Notes (Signed)
   Office Visit Note   Patient: Justin Escobar           Date of Birth: 07-22-1945           MRN: [id number] Visit Date: 05/28/2019              Requested by: Shelda Pal, [address] [place],  [place] [address] PCP: Shelda Pal, DO  Chief Complaint  Patient presents with  . Left Foot - Routine Post Op    05/19/19 left foot 1st MTP joint fusion       HPI: This is a pleasant gentleman who is 1 week status post left first MTP arthrodesis he is doing well without complaints he has been placing weight on his heel only in a postop shoe  Assessment & Plan: Visit Diagnoses: No diagnosis found.  Plan: He will follow up in 1 week at which time sutures could be harvested and radiographs of his left foot should be obtained  Follow-Up Instructions: No follow-ups on file.   Ortho Exam  Patient is alert, oriented, no adenopathy, well-dressed, normal affect, normal respiratory effort. Well-healing surgical incision.  Minimal soft tissue swelling CMS is intact no drainage from the incision healthy wound edges  Imaging: No results found. No images are attached to the encounter.  Labs: No results found for: HGBA1C, ESRSEDRATE, CRP, LABURIC, REPTSTATUS, GRAMSTAIN, CULT, LABORGA   Lab Results  Component Value Date   ALBUMIN 4.9 01/13/2019    No results found for: MG No results found for: VD25OH  No results found for: PREALBUMIN No flowsheet data found.   Body mass index is 25.33 kg/m.  Orders:  No orders of the defined types were placed in this encounter.  No orders of the defined types were placed in this encounter.    Procedures: No procedures performed  Clinical Data: No additional findings.  ROS:  All other systems negative, except as noted in the HPI. Review of Systems  Objective: Vital Signs: Ht 6\' 1"  (1.854 m)   Wt 192 lb (87.1 kg)   BMI 25.33 kg/m   Specialty Comments:  No specialty comments  available.  PMFS History: Patient Active Problem List   Diagnosis Date Noted  . Hallux rigidus, left foot   . Low serum vitamin B12 01/13/2019  . History of skin cancer 07/09/2018  . Hyperlipidemia 07/09/2018   Past Medical History:  Diagnosis Date  . History of basal cell carcinoma (BCC) of skin     Family History  Problem Relation Age of Onset  . Alzheimer's disease Mother   . Cancer Neg Hx     Past Surgical History:  Procedure Laterality Date  . ARTHRODESIS METATARSALPHALANGEAL JOINT (MTPJ) Left 05/19/2019   Procedure: LEFT 1ST METATARSALPHALANGEAL JOINT FUSION;  Surgeon: Newt Minion, MD;  Location: [facility];  Service: Orthopedics;  Laterality: Left;  . MOHS SURGERY     Social History   Occupational History  . Not on file  Tobacco Use  . Smoking status: Former Research scientist (life sciences)  . Smokeless tobacco: Never Used  Substance and Sexual Activity  . Alcohol use: Yes    Comment: 6-7 drinks per week  . Drug use: Never  . Sexual activity: Not on file

## 2019-06-04 ENCOUNTER — Other Ambulatory Visit: Payer: Self-pay

## 2019-06-04 ENCOUNTER — Ambulatory Visit (INDEPENDENT_AMBULATORY_CARE_PROVIDER_SITE_OTHER): Payer: Medicare HMO | Admitting: Orthopedic Surgery

## 2019-06-04 ENCOUNTER — Encounter: Payer: Self-pay | Admitting: Orthopedic Surgery

## 2019-06-04 ENCOUNTER — Ambulatory Visit (INDEPENDENT_AMBULATORY_CARE_PROVIDER_SITE_OTHER): Payer: Medicare HMO

## 2019-06-04 VITALS — Ht 73.0 in | Wt 192.0 lb

## 2019-06-04 DIAGNOSIS — M2022 Hallux rigidus, left foot: Secondary | ICD-10-CM | POA: Diagnosis not present

## 2019-06-04 NOTE — Progress Notes (Signed)
   Office Visit Note   Patient: Justin Escobar           Date of Birth: [birth year]           MRN: [id number] Visit Date: 06/04/2019              Requested by: Shelda Pal, [address] [place],  [place] [address] PCP: Shelda Pal, DO  Chief Complaint  Patient presents with  . Left Foot - Routine Post Op    05/19/19 left foot 1st MTP joint fusion       HPI: This is a pleasant gentleman who is almost 3 weeks status post left first MTP arthrodesis.  He is doing well.  He has been nonweightbearing in his postop shoe he is without complaints  Assessment & Plan: Visit Diagnoses:  1. Hallux rigidus, left foot     Plan: He may begin weightbearing as tolerated in the postop shoe at the 4-week mark.  He will follow-up in 4 more weeks x-ray should be obtained at that time  Follow-Up Instructions: No follow-ups on file.   Ortho Exam  Patient is alert, oriented, no adenopathy, well-dressed, normal affect, normal respiratory effort. Focused examination of his foot demonstrates healed surgical incisions surgical sutures are in place mild amount of soft tissue swelling CMS is intact no surrounding erythema no drainage  Imaging: No results found. No images are attached to the encounter.  Labs: No results found for: HGBA1C, ESRSEDRATE, CRP, LABURIC, REPTSTATUS, GRAMSTAIN, CULT, LABORGA   Lab Results  Component Value Date   ALBUMIN 4.9 01/13/2019    No results found for: MG No results found for: VD25OH  No results found for: PREALBUMIN No flowsheet data found.   Body mass index is 25.33 kg/m.  Orders:  Orders Placed This Encounter  Procedures  . XR Foot 2 Views Left   No orders of the defined types were placed in this encounter.    Procedures: No procedures performed  Clinical Data: No additional findings.  ROS:  All other systems negative, except as noted in the HPI. Review of Systems  Objective: Vital Signs: Ht 6\' 1"   (1.854 m)   Wt 192 lb (87.1 kg)   BMI 25.33 kg/m   Specialty Comments:  No specialty comments available.  PMFS History: Patient Active Problem List   Diagnosis Date Noted  . Hallux rigidus, left foot   . Low serum vitamin B12 01/13/2019  . History of skin cancer 07/09/2018  . Hyperlipidemia 07/09/2018   Past Medical History:  Diagnosis Date  . History of basal cell carcinoma (BCC) of skin     Family History  Problem Relation Age of Onset  . Alzheimer's disease Mother   . Cancer Neg Hx     Past Surgical History:  Procedure Laterality Date  . ARTHRODESIS METATARSALPHALANGEAL JOINT (MTPJ) Left 05/19/2019   Procedure: LEFT 1ST METATARSALPHALANGEAL JOINT FUSION;  Surgeon: Newt Minion, MD;  Location: [facility];  Service: Orthopedics;  Laterality: Left;  . MOHS SURGERY     Social History   Occupational History  . Not on file  Tobacco Use  . Smoking status: Former Research scientist (life sciences)  . Smokeless tobacco: Never Used  Substance and Sexual Activity  . Alcohol use: Yes    Comment: 6-7 drinks per week  . Drug use: Never  . Sexual activity: Not on file

## 2019-07-02 ENCOUNTER — Encounter: Payer: Self-pay | Admitting: Orthopedic Surgery

## 2019-07-02 ENCOUNTER — Other Ambulatory Visit: Payer: Self-pay

## 2019-07-02 ENCOUNTER — Ambulatory Visit (INDEPENDENT_AMBULATORY_CARE_PROVIDER_SITE_OTHER): Payer: Medicare HMO

## 2019-07-02 ENCOUNTER — Ambulatory Visit (INDEPENDENT_AMBULATORY_CARE_PROVIDER_SITE_OTHER): Payer: Medicare HMO | Admitting: Orthopedic Surgery

## 2019-07-02 DIAGNOSIS — M2022 Hallux rigidus, left foot: Secondary | ICD-10-CM

## 2019-07-02 NOTE — Progress Notes (Signed)
   Office Visit Note   Patient: Justin Escobar           Date of Birth: 1945-07-27           MRN: [id number] Visit Date: 07/02/2019              Requested by: Shelda Pal, [address] [place],  [place] [address] PCP: Shelda Pal, DO  Chief Complaint  Patient presents with  . Left Foot - Follow-up      HPI: Patient is a 74 year old gentleman who presents about 7 weeks status post left foot MTP fusion of the great toe.  Patient states he has been having some swelling over the IP joint has been ambulating in sandals without problems.  Assessment & Plan: Visit Diagnoses:  1. Hallux rigidus, left foot     Plan: Continue to increase his activities as tolerated follow-up as needed if he develops symptoms.  Follow-Up Instructions: Return if symptoms worsen or fail to improve.   Ortho Exam  Patient is alert, oriented, no adenopathy, well-dressed, normal affect, normal respiratory effort. Examination patient does have some swelling in the left foot there is no redness no cellulitis no tenderness to palpation or attempted distraction of the MTP joint.  There is some swelling of the IP joint.  Imaging: XR Foot Complete Left  Result Date: 07/02/2019 Three-view radiographs of the left foot shows stable MTP fusion no lucency around the screws no complicating features.  No images are attached to the encounter.  Labs: No results found for: HGBA1C, ESRSEDRATE, CRP, LABURIC, REPTSTATUS, GRAMSTAIN, CULT, LABORGA   Lab Results  Component Value Date   ALBUMIN 4.9 01/13/2019    No results found for: MG No results found for: VD25OH  No results found for: PREALBUMIN No flowsheet data found.   There is no height or weight on file to calculate BMI.  Orders:  Orders Placed This Encounter  Procedures  . XR Foot Complete Left   No orders of the defined types were placed in this encounter.    Procedures: No procedures performed  Clinical  Data: No additional findings.  ROS:  All other systems negative, except as noted in the HPI. Review of Systems  Objective: Vital Signs: There were no vitals taken for this visit.  Specialty Comments:  No specialty comments available.  PMFS History: Patient Active Problem List   Diagnosis Date Noted  . Hallux rigidus, left foot   . Low serum vitamin B12 01/13/2019  . History of skin cancer 07/09/2018  . Hyperlipidemia 07/09/2018   Past Medical History:  Diagnosis Date  . History of basal cell carcinoma (BCC) of skin     Family History  Problem Relation Age of Onset  . Alzheimer's disease Mother   . Cancer Neg Hx     Past Surgical History:  Procedure Laterality Date  . ARTHRODESIS METATARSALPHALANGEAL JOINT (MTPJ) Left 05/19/2019   Procedure: LEFT 1ST METATARSALPHALANGEAL JOINT FUSION;  Surgeon: Newt Minion, MD;  Location: [facility];  Service: Orthopedics;  Laterality: Left;  . MOHS SURGERY     Social History   Occupational History  . Not on file  Tobacco Use  . Smoking status: Former Research scientist (life sciences)  . Smokeless tobacco: Never Used  Substance and Sexual Activity  . Alcohol use: Yes    Comment: 6-7 drinks per week  . Drug use: Never  . Sexual activity: Not on file

## 2019-08-02 DIAGNOSIS — J302 Other seasonal allergic rhinitis: Secondary | ICD-10-CM | POA: Diagnosis not present

## 2019-08-02 DIAGNOSIS — J029 Acute pharyngitis, unspecified: Secondary | ICD-10-CM | POA: Diagnosis not present

## 2019-08-04 ENCOUNTER — Telehealth (INDEPENDENT_AMBULATORY_CARE_PROVIDER_SITE_OTHER): Payer: Medicare HMO | Admitting: Family Medicine

## 2019-08-04 ENCOUNTER — Other Ambulatory Visit: Payer: Self-pay

## 2019-08-04 ENCOUNTER — Encounter: Payer: Self-pay | Admitting: Family Medicine

## 2019-08-04 VITALS — Temp 96.6°F

## 2019-08-04 DIAGNOSIS — J069 Acute upper respiratory infection, unspecified: Secondary | ICD-10-CM

## 2019-08-04 MED ORDER — BENZONATATE 100 MG PO CAPS: 100.0000 mg | ORAL_CAPSULE | Freq: Three times a day (TID) | ORAL | 0 refills | Status: DC | PRN

## 2019-08-04 MED ORDER — PREDNISONE 20 MG PO TABS: 40.0000 mg | ORAL_TABLET | Freq: Every day | ORAL | 0 refills | Status: AC

## 2019-08-04 MED ORDER — AZITHROMYCIN 250 MG PO TABS: ORAL_TABLET | ORAL | 0 refills | Status: DC

## 2019-08-04 NOTE — Progress Notes (Signed)
Chief Complaint  Patient presents with  . Cough  . Nasal Congestion    Bonnielee Haff here for URI complaints. Due to COVID-19 pandemic, we are interacting via web portal for an electronic face-to-face visit. I verified patient's ID using 2 identifiers. Patient agreed to proceed with visit via this method. Patient is at home, I am at office. Patient and I are present for visit.   Duration: 4 days  Associated symptoms: sinus congestion, rhinorrhea, sore throat and cough (dry) Denies: sinus pain, itchy watery eyes, ear pain, ear drainage, wheezing, shortness of breath, myalgia and fevers Treatment to date: Zyrtec D and Astelin Sick contacts: No   Past Medical History:  Diagnosis Date  . History of basal cell carcinoma (BCC) of skin     Temp (!) 96.6 F (35.9 C) (Oral)  No conversational dyspnea Age appropriate judgment and insight Nml affect and mood  Viral URI with cough - Plan: benzonatate (TESSALON) 100 MG capsule, predniSONE (DELTASONE) 20 MG tablet  Orders as above. Zpak on Fri if no better.  Continue to push fluids, practice good hand hygiene, cover mouth when coughing. F/u prn. If starting to experience fevers, shaking, or shortness of breath, seek immediate care. Pt voiced understanding and agreement to the plan.  [place], DO 08/04/19 9:46 AM

## 2019-08-17 ENCOUNTER — Ambulatory Visit: Payer: Medicare HMO | Admitting: Family Medicine

## 2019-09-28 ENCOUNTER — Other Ambulatory Visit: Payer: Self-pay | Admitting: Family Medicine

## 2019-10-05 DIAGNOSIS — D225 Melanocytic nevi of trunk: Secondary | ICD-10-CM | POA: Diagnosis not present

## 2019-10-05 DIAGNOSIS — L218 Other seborrheic dermatitis: Secondary | ICD-10-CM | POA: Diagnosis not present

## 2019-10-05 DIAGNOSIS — L821 Other seborrheic keratosis: Secondary | ICD-10-CM | POA: Diagnosis not present

## 2019-10-05 DIAGNOSIS — Z85828 Personal history of other malignant neoplasm of skin: Secondary | ICD-10-CM | POA: Diagnosis not present

## 2019-10-05 DIAGNOSIS — L570 Actinic keratosis: Secondary | ICD-10-CM | POA: Diagnosis not present

## 2019-10-29 DIAGNOSIS — M79672 Pain in left foot: Secondary | ICD-10-CM | POA: Diagnosis not present

## 2019-10-29 DIAGNOSIS — L303 Infective dermatitis: Secondary | ICD-10-CM | POA: Diagnosis not present

## 2019-10-29 DIAGNOSIS — L600 Ingrowing nail: Secondary | ICD-10-CM | POA: Diagnosis not present

## 2019-10-29 DIAGNOSIS — M2042 Other hammer toe(s) (acquired), left foot: Secondary | ICD-10-CM | POA: Diagnosis not present

## 2019-11-19 DIAGNOSIS — R69 Illness, unspecified: Secondary | ICD-10-CM | POA: Diagnosis not present

## 2020-01-04 ENCOUNTER — Other Ambulatory Visit: Payer: Self-pay | Admitting: Family Medicine

## 2020-01-15 ENCOUNTER — Ambulatory Visit: Payer: Medicare Other | Admitting: *Deleted

## 2020-01-15 ENCOUNTER — Encounter: Payer: Self-pay | Admitting: Family Medicine

## 2020-01-15 ENCOUNTER — Ambulatory Visit (INDEPENDENT_AMBULATORY_CARE_PROVIDER_SITE_OTHER): Payer: Medicare HMO | Admitting: Family Medicine

## 2020-01-15 ENCOUNTER — Other Ambulatory Visit: Payer: Self-pay

## 2020-01-15 VITALS — BP 118/76 | HR 97 | Temp 98.0°F | Ht 73.0 in | Wt 172.4 lb

## 2020-01-15 DIAGNOSIS — Z0000 Encounter for general adult medical examination without abnormal findings: Secondary | ICD-10-CM

## 2020-01-15 DIAGNOSIS — E785 Hyperlipidemia, unspecified: Secondary | ICD-10-CM | POA: Diagnosis not present

## 2020-01-15 NOTE — Patient Instructions (Signed)
Give [name] 2-3 business days to get the results of your labs back.   Keep the diet clean and stay active.  Let us know if you need anything.  Heat (pad or rice pillow in microwave) over affected area, 10-15 minutes twice daily.   Ice/cold pack over area for 10-15 min twice daily.  EXERCISES  RANGE OF MOTION (ROM) AND STRETCHING EXERCISES These exercises may help you when beginning to rehabilitate your injury. While completing these exercises, remember:   Restoring tissue flexibility helps normal motion to return to the joints. This allows healthier, less painful movement and activity.  An effective stretch should be held for at least 30 seconds.  A stretch should never be painful. You should only feel a gentle lengthening or release in the stretched tissue.  ROM - Pendulum  Bend at the waist so that your right / left arm falls away from your body. Support yourself with your opposite hand on a solid surface, such as a table or a countertop.  Your right / left arm should be perpendicular to the ground. If it is not perpendicular, you need to lean over farther. Relax the muscles in your right / left arm and shoulder as much as possible.  Gently sway your hips and trunk so they move your right / left arm without any use of your right / left shoulder muscles.  Progress your movements so that your right / left arm moves side to side, then forward and backward, and finally, both clockwise and counterclockwise.  Complete 10-15 repetitions in each direction. Many people use this exercise to relieve discomfort in their shoulder as well as to gain range of motion. Repeat 2 times. Complete this exercise 3 times per week.  STRETCH - Flexion, Standing  Stand with good posture. With an underhand grip on your right / left hand and an overhand grip on the opposite hand, grasp a broomstick or cane so that your hands are a little more than shoulder-width apart.  Keeping your right / left elbow  straight and shoulder muscles relaxed, push the stick with your opposite hand to raise your right / left arm in front of your body and then overhead. Raise your arm until you feel a stretch in your right / left shoulder, but before you have increased shoulder pain.  Try to avoid shrugging your right / left shoulder as your arm rises by keeping your shoulder blade tucked down and toward your mid-back spine. Hold 30 seconds.  Slowly return to the starting position. Repeat 2 times. Complete this exercise 3 times per week.  STRETCH - Internal Rotation  Place your right / left hand behind your back, palm-up.  Throw a towel or belt over your opposite shoulder. Grasp the towel/belt with your right / left hand.  While keeping an upright posture, gently pull up on the towel/belt until you feel a stretch in the front of your right / left shoulder.  Avoid shrugging your right / left shoulder as your arm rises by keeping your shoulder blade tucked down and toward your mid-back spine.  Hold 30. Release the stretch by lowering your opposite hand. Repeat 2 times. Complete this exercise 3 times per week.  STRETCH - External Rotation and Abduction  Stagger your stance through a doorframe. It does not matter which foot is forward.  As instructed by your physician, physical therapist or athletic trainer, place your hands: ? And forearms above your head and on the door frame. ? And forearms at  stomach muscles tight to prevent over-extending your low-back, slowly shift your weight onto your front foot until you feel a stretch across your chest and/or in the front of your shoulders. Hold 30 seconds. Shift your weight to your back foot to release the stretch. Repeat 2 times. Complete this stretch 3 times per week.   STRENGTHENING EXERCISES  These exercises may help you when beginning to rehabilitate your injury. They may  resolve your symptoms with or without further involvement from your physician, physical therapist or athletic trainer. While completing these exercises, remember:  Muscles can gain both the endurance and the strength needed for everyday activities through controlled exercises. Complete these exercises as instructed by your physician, physical therapist or athletic trainer. Progress the resistance and repetitions only as guided. You may experience muscle soreness or fatigue, but the pain or discomfort you are trying to eliminate should never worsen during these exercises. If this pain does worsen, stop and make certain you are following the directions exactly. If the pain is still present after adjustments, discontinue the exercise until you can discuss the trouble with your clinician. If advised by your physician, during your recovery, avoid activity or exercises which involve actions that place your right / left hand or elbow above your head or behind your back or head. These positions stress the tissues which are trying to heal.  STRENGTH - Scapular Depression and Adduction With good posture, sit on a firm chair. Supported your arms in front of you with pillows, arm rests or a table top. Have your elbows in line with the sides of your body. Gently draw your shoulder blades down and toward your mid-back spine. Gradually increase the tension without tensing the muscles along the top of your shoulders and the back of your neck. Hold for 3 seconds. Slowly release the tension and relax your muscles completely before completing the next repetition. After you have practiced this exercise, remove the arm support and complete it in standing as well as sitting. Repeat 2 times. Complete this exercise 3 times per week.   STRENGTH - External Rotators Secure a rubber exercise band/tubing to a fixed object so that it is at the same height as your right / left elbow when you are standing or sitting on a firm  surface. Stand or sit so that the secured exercise band/tubing is at your side that is not injured. Bend your elbow 90 degrees. Place a folded towel or small pillow under your right / left arm so that your elbow is a few inches away from your side. Keeping the tension on the exercise band/tubing, pull it away from your body, as if pivoting on your elbow. Be sure to keep your body steady so that the movement is only coming from your shoulder rotating. Hold 3 seconds. Release the tension in a controlled manner as you return to the starting position. Repeat 2 times. Complete this exercise 3 times per week.   STRENGTH - Supraspinatus Stand or sit with good posture. Grasp a 2-3 lb weight or an exercise band/tubing so that your hand is "thumbs-up," like when you shake hands. Slowly lift your right / left hand from your thigh into the air, traveling about 30 degrees from straight out at your side. Lift your hand to shoulder height or as far as you can without increasing any shoulder pain. Initially, many people do not lift their hands above shoulder height. Avoid shrugging your right / left shoulder as your arm rises by keeping your   shoulder blade tucked down and toward your mid-back spine. Hold for 3 seconds. Control the descent of your hand as you slowly return to your starting position. Repeat 2 times. Complete this exercise 3 times per week.   STRENGTH - Shoulder Extensors Secure a rubber exercise band/tubing so that it is at the height of your shoulders when you are either standing or sitting on a firm arm-less chair. With a thumbs-up grip, grasp an end of the band/tubing in each hand. Straighten your elbows and lift your hands straight in front of you at shoulder height. Step back away from the secured end of band/tubing until it becomes tense. Squeezing your shoulder blades together, pull your hands down to the sides of your thighs. Do not allow your hands to go behind you. Hold for 3 seconds.  Slowly ease the tension on the band/tubing as you reverse the directions and return to the starting position. Repeat 2 times. Complete this exercise 3 times per week.   STRENGTH - Scapular Retractors Secure a rubber exercise band/tubing so that it is at the height of your shoulders when you are either standing or sitting on a firm arm-less chair. With a palm-down grip, grasp an end of the band/tubing in each hand. Straighten your elbows and lift your hands straight in front of you at shoulder height. Step back away from the secured end of band/tubing until it becomes tense. Squeezing your shoulder blades together, draw your elbows back as you bend them. Keep your upper arm lifted away from your body throughout the exercise. Hold 3 seconds. Slowly ease the tension on the band/tubing as you reverse the directions and return to the starting position. Repeat 2 times. Complete this exercise 3 times per week.  STRENGTH - Scapular Depressors Find a sturdy chair without wheels, such as a from a dining room table. Keeping your feet on the floor, lift your bottom from the seat and lock your elbows. Keeping your elbows straight, allow gravity to pull your body weight down. Your shoulders will rise toward your ears. Raise your body against gravity by drawing your shoulder blades down your back, shortening the distance between your shoulders and ears. Although your feet should always maintain contact with the floor, your feet should progressively support less body weight as you get stronger. Hold 3 seconds. In a controlled and slow manner, lower your body weight to begin the next repetition. Repeat 2 times. Complete this exercise 3 times per week.    This information is not intended to replace advice given to you by your health care provider. Make sure you discuss any questions you have with your health care provider.   Document Released: 01/24/2005 Document Revised: 04/02/2014 Document Reviewed:  06/24/2008 Elsevier Interactive Patient Education 2016 [employer]  

## 2020-01-15 NOTE — Progress Notes (Signed)
Chief Complaint  Patient presents with  . Annual Exam    Well Male Justin Escobar is here for a complete physical. His last physical was >1 year ago.  Current diet: in general, a "healthy" diet.   Current exercise: walking Weight trend: Intentionally decreased Fatigue out of ordinary? No. Seat belt? Yes.    Health maintenance Shingrix- Yes Colonoscopy- Yes Tetanus- Yes Hep C- Yes Pneumonia vaccine- Yes  Past Medical History:  Diagnosis Date  . History of basal cell carcinoma (BCC) of skin      Past Surgical History:  Procedure Laterality Date  . ARTHRODESIS METATARSALPHALANGEAL JOINT (MTPJ) Left 05/19/2019   Procedure: LEFT 1ST METATARSALPHALANGEAL JOINT FUSION;  Surgeon: Newt Minion, MD;  Location: [facility];  Service: Orthopedics;  Laterality: Left;  . MOHS SURGERY      Medications  Current Outpatient Medications on File Prior to Visit  Medication Sig Dispense Refill  . simvastatin (ZOCOR) 40 MG tablet Take 1 tablet by mouth once daily 90 tablet 0    Allergies Allergies  Allergen Reactions  . 5-Alpha Reductase Inhibitors   . Latex Rash    Family History Family History  Problem Relation Age of Onset  . Alzheimer's disease Mother   . Cancer Neg Hx    Review of Systems: Constitutional:  no fevers Eye:  no recent significant change in vision Ears:  No changes in hearing Nose/Mouth/Throat:  no complaints of nasal congestion, no sore throat Cardiovascular: no chest pain Respiratory:  No shortness of breath Gastrointestinal:  No change in bowel habits GU:  No frequency Integumentary:  no abnormal skin lesions reported Neurologic:  no headaches Endocrine:  denies unexplained weight changes  Exam BP 118/76 (BP Location: Left Arm, Patient Position: Sitting, Cuff Size: Normal)   Pulse 97   Temp 98 F (36.7 C) (Oral)   Ht 6\' 1"  (1.854 m)   Wt 172 lb 6 oz (78.2 kg)   SpO2 93%   BMI 22.74 kg/m  General:  well developed, well nourished,  in no apparent distress Skin:  no significant moles, warts, or growths Head:  no masses, lesions, or tenderness Eyes:  pupils equal and round, sclera anicteric without injection Ears:  canals without lesions, TMs shiny without retraction, no obvious effusion, no erythema Nose:  nares patent, septum midline, mucosa normal Throat/Pharynx:  lips and gingiva without lesion; tongue and uvula midline; non-inflamed pharynx; no exudates or postnasal drainage Lungs:  clear to auscultation, breath sounds equal bilaterally, no respiratory distress Cardio:  regular rate and rhythm, no LE edema or bruits Rectal: Deferred GI: BS+, S, NT, ND, no masses or organomegaly Musculoskeletal:  symmetrical muscle groups noted without atrophy or deformity Neuro:  gait normal; deep tendon reflexes normal and symmetric Psych: well oriented with normal range of affect and appropriate judgment/insight  Assessment and Plan  Well adult exam  Hyperlipidemia, unspecified hyperlipidemia type - Plan: Lipid panel, Comprehensive metabolic panel, Lipid panel, Comprehensive metabolic panel   Well 74 y.o. male. Counseled on diet and exercise. Other orders as above. He's getting the flu shot in a couple weeks with his wife.  Follow up in 6 mo, labs 1 week prior.  The patient voiced understanding and agreement to the plan.  [place], DO 01/15/20 12:00 PM

## 2020-01-16 LAB — COMPREHENSIVE METABOLIC PANEL
AG Ratio: 1.8 (calc) (ref 1.0–2.5)
ALT: 15 U/L (ref 9–46)
AST: 20 U/L (ref 10–35)
Albumin: 4.4 g/dL (ref 3.6–5.1)
Alkaline phosphatase (APISO): 77 U/L (ref 35–144)
BUN: 17 mg/dL (ref 7–25)
CO2: 29 mmol/L (ref 20–32)
Calcium: 9.4 mg/dL (ref 8.6–10.3)
Chloride: 102 mmol/L (ref 98–110)
Creat: 0.94 mg/dL (ref 0.70–1.18)
Globulin: 2.4 g/dL (calc) (ref 1.9–3.7)
Glucose, Bld: 89 mg/dL (ref 65–99)
Potassium: 4.2 mmol/L (ref 3.5–5.3)
Sodium: 140 mmol/L (ref 135–146)
Total Bilirubin: 0.6 mg/dL (ref 0.2–1.2)
Total Protein: 6.8 g/dL (ref 6.1–8.1)

## 2020-01-16 LAB — LIPID PANEL
Cholesterol: 113 mg/dL (ref <200)
HDL: 56 mg/dL (ref >=40)
LDL Cholesterol (Calc): 42 mg/dL (calc)
Non-HDL Cholesterol (Calc): 57 mg/dL (calc) (ref <130)
Total CHOL/HDL Ratio: 2 (calc) (ref <5.0)
Triglycerides: 69 mg/dL (ref <150)

## 2020-03-10 DIAGNOSIS — R69 Illness, unspecified: Secondary | ICD-10-CM | POA: Diagnosis not present

## 2020-04-07 DIAGNOSIS — D1801 Hemangioma of skin and subcutaneous tissue: Secondary | ICD-10-CM | POA: Diagnosis not present

## 2020-04-07 DIAGNOSIS — L814 Other melanin hyperpigmentation: Secondary | ICD-10-CM | POA: Diagnosis not present

## 2020-04-07 DIAGNOSIS — Z85828 Personal history of other malignant neoplasm of skin: Secondary | ICD-10-CM | POA: Diagnosis not present

## 2020-04-07 DIAGNOSIS — D225 Melanocytic nevi of trunk: Secondary | ICD-10-CM | POA: Diagnosis not present

## 2020-04-07 DIAGNOSIS — L718 Other rosacea: Secondary | ICD-10-CM | POA: Diagnosis not present

## 2020-04-07 DIAGNOSIS — L570 Actinic keratosis: Secondary | ICD-10-CM | POA: Diagnosis not present

## 2020-04-16 ENCOUNTER — Other Ambulatory Visit: Payer: Self-pay | Admitting: Family Medicine

## 2020-04-20 ENCOUNTER — Other Ambulatory Visit: Payer: Self-pay | Admitting: Family Medicine

## 2020-04-22 DIAGNOSIS — L600 Ingrowing nail: Secondary | ICD-10-CM | POA: Diagnosis not present

## 2020-05-06 DIAGNOSIS — B351 Tinea unguium: Secondary | ICD-10-CM | POA: Diagnosis not present

## 2020-05-06 DIAGNOSIS — M79674 Pain in right toe(s): Secondary | ICD-10-CM | POA: Diagnosis not present

## 2020-05-06 DIAGNOSIS — M79675 Pain in left toe(s): Secondary | ICD-10-CM | POA: Diagnosis not present

## 2020-05-06 DIAGNOSIS — L600 Ingrowing nail: Secondary | ICD-10-CM | POA: Diagnosis not present

## 2020-06-06 DIAGNOSIS — M79675 Pain in left toe(s): Secondary | ICD-10-CM | POA: Diagnosis not present

## 2020-06-06 DIAGNOSIS — L600 Ingrowing nail: Secondary | ICD-10-CM | POA: Diagnosis not present

## 2020-06-06 DIAGNOSIS — M79674 Pain in right toe(s): Secondary | ICD-10-CM | POA: Diagnosis not present

## 2020-06-06 DIAGNOSIS — B351 Tinea unguium: Secondary | ICD-10-CM | POA: Diagnosis not present

## 2020-07-04 DIAGNOSIS — M79674 Pain in right toe(s): Secondary | ICD-10-CM | POA: Diagnosis not present

## 2020-07-04 DIAGNOSIS — M79675 Pain in left toe(s): Secondary | ICD-10-CM | POA: Diagnosis not present

## 2020-07-04 DIAGNOSIS — L600 Ingrowing nail: Secondary | ICD-10-CM | POA: Diagnosis not present

## 2020-07-04 DIAGNOSIS — B351 Tinea unguium: Secondary | ICD-10-CM | POA: Diagnosis not present

## 2020-07-07 ENCOUNTER — Telehealth (INDEPENDENT_AMBULATORY_CARE_PROVIDER_SITE_OTHER): Payer: Medicare HMO | Admitting: Family Medicine

## 2020-07-07 DIAGNOSIS — U071 COVID-19: Secondary | ICD-10-CM | POA: Diagnosis not present

## 2020-07-07 DIAGNOSIS — Z20822 Contact with and (suspected) exposure to covid-19: Secondary | ICD-10-CM | POA: Diagnosis not present

## 2020-07-07 MED ORDER — BENZONATATE 100 MG PO CAPS: 100.0000 mg | ORAL_CAPSULE | Freq: Three times a day (TID) | ORAL | 0 refills | Status: DC | PRN

## 2020-07-07 NOTE — Patient Instructions (Signed)
  HOME CARE TIPS:   -I sent the medication(s) we discussed to your pharmacy: Meds ordered this encounter  Medications  . benzonatate (TESSALON PERLES) 100 MG capsule    Sig: Take 1 capsule (100 mg total) by mouth 3 (three) times daily as needed.    Dispense:  20 capsule    Refill:  0     -I sent a message to the outpatient Covid treatment center letting them know that you are interested in treatment. Not all patients referred received treatment based on available treatment and risk factors for severe disease.  -can use tylenol f needed for fevers, aches and pains per instructions  -can use nasal saline a few times per day if you have nasal congestion  -stay hydrated, drink plenty of fluids and eat small healthy meals - avoid dairy  -can take 1000 IU (67mcg) Vit D3 and 100-500 mg of Vit C daily per instructions  -If the Covid test is positive, check out the CDC website for more information on home care, transmission and treatment for COVID19  -follow up with your doctor in 2-3 days unless improving and feeling better  -stay home while sick, except to seek medical care, and if you have COVID19 ideally it would be best to stay home for a full 10 days since the onset of symptoms PLUS one day of no fever and feeling better. Wear a good mask (such as N95 or KN95) if around others to reduce the risk of transmission.  It was nice to meet you today, and I really hope you are feeling better soon. I help [facility] out with telemedicine visits on Tuesdays and Thursdays and am available for visits on those days. If you have any concerns or questions following this visit please schedule a follow up visit with your Primary Care doctor or seek care at a local urgent care clinic to avoid delays in care.    Seek in person care or schedule a follow up video visit promptly if your symptoms worsen, you are having chest pain, difficulty breathing, severe headaches, or feeling very tired, new concerns arise  or you are not improving with treatment. Call 911 and/or seek emergency care if your symptoms are severe or life threatening.

## 2020-07-07 NOTE — Progress Notes (Signed)
Virtual Visit via Telephone Note  I connected with Justin Escobar on 07/07/20 at  3:00 PM EDT by telephone and verified that I am speaking with the correct person using two identifiers.   I discussed the limitations, risks, security and privacy concerns of performing an evaluation and management service by telephone and the availability of in person appointments. I also discussed with the patient that there may be a patient responsible charge related to this service. The patient expressed understanding and agreed to proceed.  Location patient: home, [place] Location provider: work or home office Participants present for the call: patient, provider Patient did not have a visit with me in the prior 7 days to address this/these issue(s).   History of Present Illness:  Acute telemedicine visit for ? covid19 illness: -Onset: yesterday, daughter has covid and he was exposed a few days ago -had + covid test today -Symptoms include: nasal congestion, cough, diarrhea, body aches, fatigue, headaches, fever (high 100.3) -Denies: SOB, CP, vomiting, inability to eat/drink/get out of bed, severe headache -Has tried:musinex -Pertinent past medical history: only risk factor for covid is age -Pertinent medication allergies: latex, 5 alpha reductase inhibitors -COVID-19 vaccine status: 3 doses of pfizer   Observations/Objective: Patient sounds cheerful and well on the phone. I do not appreciate any SOB. Speech and thought processing are grossly intact. Patient reported vitals:  Assessment and Plan:  COVID-19 - Plan: Ambulatory referral for Covid Treatment  -we discussed possible serious and likely etiologies, options for evaluation and workup, limitations of telemedicine visit vs in person visit, treatment, treatment risks and precautions. Pt prefers to treat via telemedicine empirically rather than in person at this moment.  Discussed treatment options, ideal treatment window, potential complications,  isolation and precautions for COVID-19.  He wanted referral for possible Covid treatment because of his age.  Referral placed.  Did let them know I was unsure if treatment would be given without any underlying health problems, however I have seen some treatment given her age at times.  We discussed options for symptomatic care summarized in patient instructions.  He did want a prescription for cough, Tessalon Rx sent.   Scheduled follow up with PCP offered: Agrees to follow-up if needed. Advised to seek prompt in person care if worsening, new symptoms arise, or if is not improving with treatment. Advised of options for inperson care in case PCP office not available. Did let the patient know that I only do telemedicine shifts for [facility] on Tuesdays and Thursdays and advised a follow up visit with PCP or at an [facility] if has further questions or concerns.   Follow Up Instructions:  I did not refer this patient for an OV with me in the next 24 hours for this/these issue(s).  I discussed the assessment and treatment plan with the patient. The patient was provided an opportunity to ask questions and all were answered. The patient agreed with the plan and demonstrated an understanding of the instructions.   I spent 18 minutes on the date of this visit in the care of this patient. See summary of tasks completed to properly care for this patient in the detailed notes above which also included counseling of above, review of PMH, medications, allergies, evaluation of the patient and ordering and/or  instructing patient on testing and care options.     Lucretia Kern, DO

## 2020-07-08 ENCOUNTER — Other Ambulatory Visit (HOSPITAL_COMMUNITY): Payer: Self-pay

## 2020-07-08 ENCOUNTER — Telehealth: Payer: Self-pay | Admitting: Unknown Physician Specialty

## 2020-07-08 ENCOUNTER — Telehealth: Payer: Self-pay

## 2020-07-08 MED ORDER — NIRMATRELVIR/RITONAVIR (PAXLOVID)TABLET
3.0000 | ORAL_TABLET | Freq: Two times a day (BID) | ORAL | 0 refills | Status: AC
  Filled 2020-07-08: qty 30, 5d supply, fill #0

## 2020-07-08 NOTE — Telephone Encounter (Signed)
Outpatient Oral COVID Treatment Note  I connected with Justin Escobar on 07/08/2020/11:49 AM by telephone and verified that I am speaking with the correct person using two identifiers.  I discussed the limitations, risks, security, and privacy concerns of performing an evaluation and management service by telephone and the availability of in person appointments. I also discussed with the patient that there may be a patient responsible charge related to this service. The patient expressed understanding and agreed to proceed.  Patient location: home Provider location: home  Diagnosis: COVID-19 infection  Purpose of visit: Discussion of potential use of Molnupiravir or Paxlovid, a new treatment for mild to moderate COVID-19 viral infection in non-hospitalized patients.   Subjective: Patient is a 75 y.o. male who has been diagnosed with COVID 19 viral infection.  Their symptoms began on 4/13 with cough.    Past Medical History:  Diagnosis Date  . History of basal cell carcinoma (BCC) of skin     Allergies  Allergen Reactions  . 5-Alpha Reductase Inhibitors   . Latex Rash     Current Outpatient Medications:  .  benzonatate (TESSALON PERLES) 100 MG capsule, Take 1 capsule (100 mg total) by mouth 3 (three) times daily as needed., Disp: 20 capsule, Rfl: 0 .  simvastatin (ZOCOR) 40 MG tablet, Take 1 tablet by mouth once daily, Disp: 90 tablet, Rfl: 0  Objective: Patient appears/sounds congested.  They are in no apparent distress.  Breathing is non labored.  Mood and behavior are normal.  Laboratory Data:  No results found for this or any previous visit (from the past 2160 hour(s)).   Assessment: 75 y.o. male with mild/moderate COVID 19 viral infection diagnosed on 4/13 at high risk for progression to severe COVID 19.  Plan:  This patient is a 75 y.o. male that meets the following criteria for Emergency Use Authorization of: Paxlovid 1. Age >12 yr AND > 40 kg 2. SARS-COV-2 positive  test 3. Symptom onset < 5 days 4. Mild-to-moderate COVID disease with high risk for severe progression to hospitalization or death  I have spoken and communicated the following to the patient or parent/caregiver regarding: 1. Paxlovid is an unapproved drug that is authorized for use under an Emergency Use Authorization.  2. There are no adequate, approved, available products for the treatment of COVID-19 in adults who have mild-to-moderate COVID-19 and are at high risk for progressing to severe COVID-19, including hospitalization or death. 3. Other therapeutics are currently authorized. For additional information on all products authorized for treatment or prevention of COVID-19, please see [employer].  4. There are benefits and risks of taking this treatment as outlined in the "Fact Sheet for Patients and Caregivers."  5. "Fact Sheet for Patients and Caregivers" was reviewed with patient. A hard copy will be provided to patient from pharmacy prior to the patient receiving treatment. 6. Patients should continue to self-isolate and use infection control measures (e.g., wear mask, isolate, social distance, avoid sharing personal items, clean and disinfect "high touch" surfaces, and frequent handwashing) according to CDC guidelines.  7. The patient or parent/caregiver has the option to accept or refuse treatment. 8. Patient medication history was reviewed for potential drug interactions:Interaction with home meds: Simvastin 9. Patient's GFR was calculated to be 84, and they were therefore prescribed Normal dose (GFR>60) - nirmatrelvir 150mg  tab (2 tablet) by mouth twice daily AND ritonavir 100mg  tab (1 tablet) by mouth twice daily   After reviewing above information with the patient, the patient agrees to receive  Paxlovid.  Follow up instructions:    . Take prescription BID x 5 days as  directed . Reach out to pharmacist for counseling on medication if desired . For concerns regarding further COVID symptoms please follow up with your PCP or urgent care . For urgent or life-threatening issues, seek care at your local emergency department  The patient was provided an opportunity to ask questions, and all were answered. The patient agreed with the plan and demonstrated an understanding of the instructions.   Script sent to [facility] and opted to pick up RX.  The patient was advised to call their PCP or seek an in-person evaluation if the symptoms worsen or if the condition fails to improve as anticipated.   I provided 20 minutes of non face-to-face telephone visit time during this encounter, and > 50% was spent counseling as documented under my assessment & plan.  Kathrine Haddock, NP 07/08/2020 /11:49 AM

## 2020-07-08 NOTE — Telephone Encounter (Signed)
Called to discuss with patient about COVID-19 symptoms and the use of one of the available treatments for those with mild to moderate Covid symptoms and at a high risk of hospitalization.  Pt appears to qualify for outpatient treatment due to co-morbid conditions and/or a member of an at-risk group in accordance with the FDA Emergency Use Authorization.    Symptom onset: 07/06/20 Cough,diarrhea,body aches, fatigue, headache, fever Vaccinated: Yes Booster? Yes Immunocompromised? No Qualifiers: Age  Pt. Would to speak with APP.   Marcello Moores

## 2020-07-20 ENCOUNTER — Other Ambulatory Visit: Payer: Self-pay | Admitting: Family Medicine

## 2020-08-01 DIAGNOSIS — M79674 Pain in right toe(s): Secondary | ICD-10-CM | POA: Diagnosis not present

## 2020-08-01 DIAGNOSIS — M79675 Pain in left toe(s): Secondary | ICD-10-CM | POA: Diagnosis not present

## 2020-08-01 DIAGNOSIS — B351 Tinea unguium: Secondary | ICD-10-CM | POA: Diagnosis not present

## 2020-08-01 DIAGNOSIS — L600 Ingrowing nail: Secondary | ICD-10-CM | POA: Diagnosis not present

## 2020-08-15 ENCOUNTER — Encounter (HOSPITAL_COMMUNITY): Payer: Self-pay

## 2020-08-15 ENCOUNTER — Other Ambulatory Visit: Payer: Self-pay

## 2020-08-15 ENCOUNTER — Ambulatory Visit (HOSPITAL_COMMUNITY)
Admission: EM | Admit: 2020-08-15 | Discharge: 2020-08-15 | Disposition: A | Discharge status: Home / Self Care | Payer: Medicare HMO | Attending: Family Medicine | Admitting: Family Medicine

## 2020-08-15 DIAGNOSIS — S8991XA Unspecified injury of right lower leg, initial encounter: Secondary | ICD-10-CM | POA: Diagnosis not present

## 2020-08-15 DIAGNOSIS — Z5189 Encounter for other specified aftercare: Secondary | ICD-10-CM

## 2020-08-15 MED ORDER — MUPIROCIN 2 % EX OINT: 1.0000 "application " | TOPICAL_OINTMENT | Freq: Two times a day (BID) | CUTANEOUS | 0 refills | Status: DC

## 2020-08-15 NOTE — ED Provider Notes (Signed)
  [place]   [id number] 08/15/20 Arrival Time: 7353  ASSESSMENT & PLAN:  1. Visit for wound check    No signs of abscess formation.   Meds ordered this encounter  Medications  . mupirocin ointment (BACTROBAN) 2 %    Sig: Apply 1 application topically 2 (two) times daily.    Dispense:  22 g    Refill:  0   Above if he decides to use; otherwise close observation. Redness around wound is likely inflammatory.  May f/u here if worsening.  Reviewed expectations re: course of current medical issues. Questions answered. Outlined signs and symptoms indicating need for more acute intervention. Patient verbalized understanding. After Visit Summary given.   SUBJECTIVE:  Justin Escobar is a 75 y.o. male who presents with a wound to his R anterior lower leg; hit with board approx 2 w ago; feels this is healing well but noticed some redness around wound. No drainage or bleeding. Afebrile.  OBJECTIVE:  Vitals:   08/15/20 1051  BP: (!) 144/86  Pulse: 70  Resp: 18  Temp: 98.4 F (36.9 C)  TempSrc: Oral  SpO2: 97%     General appearance: alert; no distress Skin: anterior lower RLE with healing superficial wound; mild surrounding erythema measuring approx 0.5 cm that is normal skin temperature; no fluctuance bleeding or drainage Psychological: alert and cooperative; normal mood and affect   Allergies  Allergen Reactions  . 5-Alpha Reductase Inhibitors   . Latex Rash    Past Medical History:  Diagnosis Date  . History of basal cell carcinoma (BCC) of skin    Social History   Socioeconomic History  . Marital status: Married    Spouse name: Not on file  . Number of children: Not on file  . Years of education: Not on file  . Highest education level: Not on file  Occupational History  . Not on file  Tobacco Use  . Smoking status: Former Research scientist (life sciences)  . Smokeless tobacco: Never Used  Substance and Sexual Activity  . Alcohol use: Yes    Comment: 6-7 drinks per  week  . Drug use: Never  . Sexual activity: Not on file  Other Topics Concern  . Not on file  Social History Narrative  . Not on file   Social Determinants of Health   Financial Resource Strain: Not on file  Food Insecurity: Not on file  Transportation Needs: Not on file  Physical Activity: Not on file  Stress: Not on file  Social Connections: Not on file         Vanessa Kick, MD 08/15/20 1243

## 2020-08-15 NOTE — ED Triage Notes (Signed)
Pt presents with small wound on shin of right leg from a board falling on it X 2 weeks ago; pt states the area is red and tender.

## 2020-09-01 ENCOUNTER — Encounter (HOSPITAL_COMMUNITY): Payer: Self-pay

## 2020-09-01 ENCOUNTER — Ambulatory Visit (HOSPITAL_COMMUNITY)
Admission: EM | Admit: 2020-09-01 | Discharge: 2020-09-01 | Disposition: A | Discharge status: Home / Self Care | Payer: Medicare HMO | Attending: Student | Admitting: Student

## 2020-09-01 ENCOUNTER — Other Ambulatory Visit: Payer: Self-pay

## 2020-09-01 DIAGNOSIS — J069 Acute upper respiratory infection, unspecified: Secondary | ICD-10-CM | POA: Diagnosis not present

## 2020-09-01 DIAGNOSIS — J208 Acute bronchitis due to other specified organisms: Secondary | ICD-10-CM | POA: Diagnosis not present

## 2020-09-01 DIAGNOSIS — Z8616 Personal history of COVID-19: Secondary | ICD-10-CM | POA: Diagnosis not present

## 2020-09-01 MED ORDER — PREDNISONE 50 MG PO TABS: 50.0000 mg | ORAL_TABLET | Freq: Every day | ORAL | 0 refills | Status: AC

## 2020-09-01 MED ORDER — PROMETHAZINE-DM 6.25-15 MG/5ML PO SYRP: 5.0000 mL | ORAL_SOLUTION | Freq: Four times a day (QID) | ORAL | 0 refills | Status: DC | PRN

## 2020-09-01 NOTE — ED Triage Notes (Signed)
Pt presents with a cough x 3 week. Pt states he sometimes coughs up yellow mucus.

## 2020-09-01 NOTE — Discharge Instructions (Addendum)
-  You have a viral bronchitis that is probably related to having a cold or virus.  This is not a bacterial infection or pneumonia. -Prednisone, 1 pill taken 5 days in a row.  Try taking this earlier in the day as it can give you energy.  -Promethazine DM cough syrup for congestion/cough. This could make you drowsy, so take at night before bed. -Seek additional medical attention if you develop new symptoms like shortness of breath, new fever/chills, chest pain, dizziness, weakness

## 2020-09-01 NOTE — ED Provider Notes (Signed)
[place]    CSN: [id number] Arrival date & time: 09/01/20  4098      History   Chief Complaint Chief Complaint  Patient presents with   Cough   Sore Throat    HPI Justin Escobar is a 75 y.o. male presenting with URI symptoms- cough x1 week, getting worse.  Medical history noncontributory, denies history of pulmonary disease.  Also denies allergic rhinitis component.  States he has had a hacking cough for 1 week that is sometimes productive of yellow mucus.  Also with some yellow nasal congestion.  Denies fever/chills, shortness of breath, wheezing, dizziness, chest pain.  HPI  Past Medical History:  Diagnosis Date   History of basal cell carcinoma (BCC) of skin     Patient Active Problem List   Diagnosis Date Noted   Hallux rigidus, left foot    Low serum vitamin B12 01/13/2019   History of skin cancer 07/09/2018   Hyperlipidemia 07/09/2018    Past Surgical History:  Procedure Laterality Date   ARTHRODESIS METATARSALPHALANGEAL JOINT (MTPJ) Left 05/19/2019   Procedure: LEFT 1ST METATARSALPHALANGEAL JOINT FUSION;  Surgeon: Newt Minion, MD;  Location: [facility];  Service: Orthopedics;  Laterality: Left;   MOHS SURGERY         Home Medications    Prior to Admission medications   Medication Sig Start Date End Date Taking? Authorizing Provider  predniSONE (DELTASONE) 50 MG tablet Take 1 tablet (50 mg total) by mouth daily for 5 days. 09/01/20 09/06/20 Yes Hazel Sams, PA-C  promethazine-dextromethorphan (PROMETHAZINE-DM) 6.25-15 MG/5ML syrup Take 5 mLs by mouth 4 (four) times daily as needed for cough. 09/01/20  Yes Hazel Sams, PA-C  benzonatate (TESSALON PERLES) 100 MG capsule Take 1 capsule (100 mg total) by mouth 3 (three) times daily as needed. 07/07/20   Lucretia Kern, DO  mupirocin ointment (BACTROBAN) 2 % Apply 1 application topically 2 (two) times daily. 08/15/20   Vanessa Kick, MD  simvastatin (ZOCOR) 40 MG tablet Take 1 tablet  by mouth once daily 07/20/20   Wendling, Crosby Oyster, DO    Family History Family History  Problem Relation Age of Onset   Alzheimer's disease Mother    Cancer Neg Hx     Social History Social History   Tobacco Use   Smoking status: Former    Pack years: 0.00   Smokeless tobacco: Never  Substance Use Topics   Alcohol use: Yes    Comment: 6-7 drinks per week   Drug use: Never     Allergies   5-alpha reductase inhibitors and Latex   Review of Systems Review of Systems  Constitutional:  Negative for appetite change, chills and fever.  HENT:  Positive for congestion. Negative for ear pain, rhinorrhea, sinus pressure, sinus pain and sore throat.   Eyes:  Negative for redness and visual disturbance.  Respiratory:  Positive for cough. Negative for chest tightness, shortness of breath and wheezing.   Cardiovascular:  Negative for chest pain and palpitations.  Gastrointestinal:  Negative for abdominal pain, constipation, diarrhea, nausea and vomiting.  Genitourinary:  Negative for dysuria, frequency and urgency.  Musculoskeletal:  Negative for myalgias.  Neurological:  Negative for dizziness, weakness and headaches.  Psychiatric/Behavioral:  Negative for confusion.   All other systems reviewed and are negative.   Physical Exam Triage Vital Signs ED Triage Vitals  Enc Vitals Group     BP      Pulse      Resp  Temp      Temp src      SpO2      Weight      Height      Head Circumference      Peak Flow      Pain Score      Pain Loc      Pain Edu?      Excl. in [place]?    No data found.  Updated Vital Signs BP (!) 150/86 (BP Location: Right Arm)   Pulse 82   Temp 98.7 F (37.1 C) (Oral)   Resp 17   SpO2 97%   Visual Acuity Right Eye Distance:   Left Eye Distance:   Bilateral Distance:    Right Eye Near:   Left Eye Near:    Bilateral Near:     Physical Exam Vitals reviewed.  Constitutional:      General: He is not in acute distress.     Appearance: Normal appearance. He is not ill-appearing.  HENT:     Head: Normocephalic and atraumatic.     Right Ear: Hearing, tympanic membrane, ear canal and external ear normal. No swelling or tenderness. There is no impacted cerumen. No mastoid tenderness. Tympanic membrane is not perforated, erythematous, retracted or bulging.     Left Ear: Hearing, tympanic membrane, ear canal and external ear normal. No swelling or tenderness. There is no impacted cerumen. No mastoid tenderness. Tympanic membrane is not perforated, erythematous, retracted or bulging.     Nose:     Right Sinus: No maxillary sinus tenderness or frontal sinus tenderness.     Left Sinus: No maxillary sinus tenderness or frontal sinus tenderness.     Mouth/Throat:     Mouth: Mucous membranes are moist.     Pharynx: Uvula midline. No oropharyngeal exudate or posterior oropharyngeal erythema.     Tonsils: No tonsillar exudate.  Cardiovascular:     Rate and Rhythm: Normal rate and regular rhythm.     Heart sounds: Normal heart sounds.  Pulmonary:     Effort: Pulmonary effort is normal. No tachypnea, bradypnea, accessory muscle usage or prolonged expiration.     Breath sounds: Normal air entry. Rhonchi present. No wheezing or rales.     Comments: Occ cough Few rhonchi lower lung fields bilaterally  Chest:     Chest wall: No tenderness.  Abdominal:     General: Abdomen is flat. Bowel sounds are normal.     Tenderness: There is no abdominal tenderness. There is no guarding or rebound.  Lymphadenopathy:     Cervical: No cervical adenopathy.  Neurological:     General: No focal deficit present.     Mental Status: He is alert and oriented to person, place, and time.  Psychiatric:        Attention and Perception: Attention and perception normal.        Mood and Affect: Mood and affect normal.        Behavior: Behavior normal. Behavior is cooperative.        Thought Content: Thought content normal.        Judgment: Judgment  normal.     UC Treatments / Results  Labs (all labs ordered are listed, but only abnormal results are displayed) Labs Reviewed - No data to display  EKG   Radiology No results found.  Procedures Procedures (including critical care time)  Medications Ordered in UC Medications - No data to display  Initial Impression / Assessment and Plan / UC Course  I have reviewed the triage vital signs and the nursing notes.  Pertinent labs & imaging results that were available during my care of the patient were reviewed by me and considered in my medical decision making (see chart for details).    This patient is a 75 year old male presenting with acute bronchitis following viral URI.  Afebrile, nontachycardic.  Few rhonchi but no wheezes or rales.  Oxygenating well on room air.  This patient has had all 4 COVID shots and also had COVID-19 1 month ago.  Prednisone and promethazine as below, he is not a diabetic.  Reassurance provided that he does not need an antibiotic.  ED return precautions discussed.  Final Clinical Impressions(s) / UC Diagnoses   Final diagnoses:  Viral URI with cough  History of COVID-19  Viral bronchitis     Discharge Instructions      -You have a viral bronchitis that is probably related to having a cold or virus.  This is not a bacterial infection or pneumonia. -Prednisone, 1 pill taken 5 days in a row.  Try taking this earlier in the day as it can give you energy.  -Promethazine DM cough syrup for congestion/cough. This could make you drowsy, so take at night before bed. -Seek additional medical attention if you develop new symptoms like shortness of breath, new fever/chills, chest pain, dizziness, weakness      ED Prescriptions     Medication Sig Dispense Auth. Provider   predniSONE (DELTASONE) 50 MG tablet Take 1 tablet (50 mg total) by mouth daily for 5 days. 5 tablet Hazel Sams, PA-C   promethazine-dextromethorphan (PROMETHAZINE-DM)  6.25-15 MG/5ML syrup Take 5 mLs by mouth 4 (four) times daily as needed for cough. 118 mL Hazel Sams, PA-C      PDMP not reviewed this encounter.   Hazel Sams, PA-C 09/01/20 1112

## 2020-09-07 ENCOUNTER — Other Ambulatory Visit: Payer: Self-pay

## 2020-09-07 ENCOUNTER — Ambulatory Visit (INDEPENDENT_AMBULATORY_CARE_PROVIDER_SITE_OTHER): Payer: Medicare HMO | Admitting: Family Medicine

## 2020-09-07 ENCOUNTER — Encounter: Payer: Self-pay | Admitting: Family Medicine

## 2020-09-07 VITALS — BP 130/80 | HR 84 | Temp 98.3°F | Ht 73.0 in | Wt 175.2 lb

## 2020-09-07 DIAGNOSIS — B9689 Other specified bacterial agents as the cause of diseases classified elsewhere: Secondary | ICD-10-CM

## 2020-09-07 DIAGNOSIS — J208 Acute bronchitis due to other specified organisms: Secondary | ICD-10-CM | POA: Diagnosis not present

## 2020-09-07 MED ORDER — DOXYCYCLINE HYCLATE 100 MG PO TABS: 100.0000 mg | ORAL_TABLET | Freq: Two times a day (BID) | ORAL | 0 refills | Status: AC

## 2020-09-07 MED ORDER — BENZONATATE 100 MG PO CAPS: 100.0000 mg | ORAL_CAPSULE | Freq: Three times a day (TID) | ORAL | 0 refills | Status: DC | PRN

## 2020-09-07 NOTE — Patient Instructions (Signed)
Continue to push fluids, practice good hand hygiene, and cover your mouth if you cough.  If you start having fevers, shaking or shortness of breath, seek immediate care.  Take Mucinex and the Benzonatate for the next couple days. If you don't improve, take the doxycycline.  Let us know if you need anything.

## 2020-09-07 NOTE — Progress Notes (Signed)
Chief Complaint  Patient presents with   Hospitalization Follow-up   Cough   Diarrhea    Justin Escobar here for URI complaints.  Duration: 2 weeks  Associated symptoms:  coughing, diarrhea started today Denies: sinus congestion, sinus pain, rhinorrhea, itchy watery eyes, ear pain, ear drainage, sore throat, wheezing, shortness of breath, myalgia, and fevers, N/V Treatment to date: prednisone burst, phenergan DM, Coricidin  Sick contacts: No Tested neg for covid.   Past Medical History:  Diagnosis Date   History of basal cell carcinoma (BCC) of skin     BP 130/80   Pulse 84   Temp 98.3 F (36.8 C) (Oral)   Ht 6\' 1"  (1.854 m)   Wt 175 lb 4 oz (79.5 kg)   SpO2 98%   BMI 23.12 kg/m  General: Awake, alert, appears stated age HEENT: AT, [place], ears patent b/l and TM's neg, nares patent w/o discharge, pharynx pink and without exudates, MMM Neck: No masses or asymmetry Heart: RRR Lungs: CTAB, no accessory muscle use Psych: Age appropriate judgment and insight, normal mood and affect  Acute bacterial bronchitis - Plan: benzonatate (TESSALON) 100 MG capsule, doxycycline (VIBRA-TABS) 100 MG tablet  For the next 2 d, use Mucinex and benzonatate. If no better, then use doxycycline 100 mg bid for 7 d. Diarrhea should be self-limited.  Continue to push fluids, practice good hand hygiene, cover mouth when coughing. F/u prn. If starting to experience fevers, shaking, or shortness of breath, seek immediate care. Pt voiced understanding and agreement to the plan.  [place], DO 09/07/20 3:37 PM

## 2020-09-16 DIAGNOSIS — Z85828 Personal history of other malignant neoplasm of skin: Secondary | ICD-10-CM | POA: Diagnosis not present

## 2020-09-16 DIAGNOSIS — Z87891 Personal history of nicotine dependence: Secondary | ICD-10-CM | POA: Diagnosis not present

## 2020-09-16 DIAGNOSIS — E785 Hyperlipidemia, unspecified: Secondary | ICD-10-CM | POA: Diagnosis not present

## 2020-09-16 DIAGNOSIS — R030 Elevated blood-pressure reading, without diagnosis of hypertension: Secondary | ICD-10-CM | POA: Diagnosis not present

## 2020-09-16 DIAGNOSIS — Z809 Family history of malignant neoplasm, unspecified: Secondary | ICD-10-CM | POA: Diagnosis not present

## 2020-10-03 ENCOUNTER — Encounter: Payer: Self-pay | Admitting: Family Medicine

## 2020-10-03 ENCOUNTER — Other Ambulatory Visit: Payer: Self-pay

## 2020-10-03 ENCOUNTER — Other Ambulatory Visit: Payer: Self-pay | Admitting: Family Medicine

## 2020-10-03 ENCOUNTER — Ambulatory Visit (INDEPENDENT_AMBULATORY_CARE_PROVIDER_SITE_OTHER): Payer: Medicare HMO | Admitting: Family Medicine

## 2020-10-03 ENCOUNTER — Ambulatory Visit (HOSPITAL_BASED_OUTPATIENT_CLINIC_OR_DEPARTMENT_OTHER)
Admission: RE | Admit: 2020-10-03 | Discharge: 2020-10-03 | Disposition: A | Discharge status: Home / Self Care | Payer: Medicare HMO | Source: Ambulatory Visit | Attending: Family Medicine | Admitting: Family Medicine

## 2020-10-03 VITALS — BP 122/82 | HR 80 | Temp 98.3°F | Ht 73.0 in | Wt 177.0 lb

## 2020-10-03 DIAGNOSIS — R059 Cough, unspecified: Secondary | ICD-10-CM | POA: Diagnosis not present

## 2020-10-03 DIAGNOSIS — M2040 Other hammer toe(s) (acquired), unspecified foot: Secondary | ICD-10-CM

## 2020-10-03 MED ORDER — FLUTICASONE PROPIONATE HFA 110 MCG/ACT IN AERO: 2.0000 | INHALATION_SPRAY | Freq: Two times a day (BID) | RESPIRATORY_TRACT | 1 refills | Status: DC

## 2020-10-03 NOTE — Progress Notes (Signed)
Chief Complaint  Patient presents with   Follow-up    Subjective: Patient is a 75 y.o. male here for f/u.  Coughing, largely productive, for past 6 weeks. Doxy did not help. Mucinex, Coricidin, prednisone, phenergan DM, Tessalon Perles not helpful. Throat painful from coughing, has thick drainage. No fevers, ear pain, itchy eyes, N/V/D, wheezing, SOB, nasal congestion. No sick contacts, tested neg for covid.   Pt had surgery on his L foot and is seeing a podiatrist in [place]. He does not mesh well w his personality and is requesting a 2nd opinion.   Past Medical History:  Diagnosis Date   History of basal cell carcinoma (BCC) of skin     Objective: BP 122/82   Pulse 80   Temp 98.3 F (36.8 C) (Oral)   Ht 6\' 1"  (1.854 m)   Wt 177 lb (80.3 kg)   SpO2 95%   BMI 23.35 kg/m  General: Awake, appears stated age HEENT: MMM, EOMi, ears neg, nares patent w/o dc Heart: RRR, no LE edema Lungs: CTAB, no rales, wheezes or rhonchi. No accessory muscle use Psych: Age appropriate judgment and insight, normal affect and mood  Assessment and Plan: Cough - Plan: DG Chest 2 View  Hammer toe, unspecified laterality - Plan: Ambulatory referral to Podiatry  Ck CXR. If he thinks it is related to PND, I gave him info on PO antihist and INCS. Could also consider ICS.  Will refer to [facility] for 2nd opinion. The patient voiced understanding and agreement to the plan.  Around 32 minutes were spent with the patient in addition to reviewing their chart information on the same day of the visit.   [place], DO 10/03/20  8:42 AM

## 2020-10-03 NOTE — Patient Instructions (Addendum)
Give [name] 2-3 business days to get the results of your labs back.   Keep the diet clean and stay active.  To dry up the nose: Claritin (loratadine), Allegra (fexofenadine), Zyrtec (cetirizine) which is also equivalent to Xyzal (levocetirizine); these are listed in order from weakest to strongest. Generic, and therefore cheaper, options are in the parentheses.   Flonase (fluticasone); nasal spray that is over the counter. 2 sprays each nostril, once daily. Aim towards the same side eye when you spray.  There are available OTC, and the generic versions, which may be cheaper, are in parentheses. Show this to a pharmacist if you have trouble finding any of these items.  Let us know if you need anything.

## 2020-10-06 DIAGNOSIS — D2261 Melanocytic nevi of right upper limb, including shoulder: Secondary | ICD-10-CM | POA: Diagnosis not present

## 2020-10-06 DIAGNOSIS — Z85828 Personal history of other malignant neoplasm of skin: Secondary | ICD-10-CM | POA: Diagnosis not present

## 2020-10-06 DIAGNOSIS — D2272 Melanocytic nevi of left lower limb, including hip: Secondary | ICD-10-CM | POA: Diagnosis not present

## 2020-10-06 DIAGNOSIS — D2271 Melanocytic nevi of right lower limb, including hip: Secondary | ICD-10-CM | POA: Diagnosis not present

## 2020-10-06 DIAGNOSIS — L570 Actinic keratosis: Secondary | ICD-10-CM | POA: Diagnosis not present

## 2020-10-06 DIAGNOSIS — D225 Melanocytic nevi of trunk: Secondary | ICD-10-CM | POA: Diagnosis not present

## 2020-10-10 ENCOUNTER — Ambulatory Visit: Payer: Medicare HMO | Admitting: Podiatry

## 2020-10-10 ENCOUNTER — Ambulatory Visit (INDEPENDENT_AMBULATORY_CARE_PROVIDER_SITE_OTHER): Payer: Medicare HMO

## 2020-10-10 ENCOUNTER — Other Ambulatory Visit: Payer: Self-pay

## 2020-10-10 DIAGNOSIS — R208 Other disturbances of skin sensation: Secondary | ICD-10-CM

## 2020-10-10 DIAGNOSIS — Z9889 Other specified postprocedural states: Secondary | ICD-10-CM

## 2020-10-10 DIAGNOSIS — R200 Anesthesia of skin: Secondary | ICD-10-CM

## 2020-10-10 NOTE — Progress Notes (Signed)
   HPI: 75 y.o. male presenting today as a new patient for evaluation of left foot numbness and pain.  Patient states that he had surgery in February 2021 with Dr. Sharol Given for left great toe arthrodesis.  Patient states that he has been experiencing pain in the left great toe ever since the surgery.  He also experiences numbness to the area.  He presents for further treatment and evaluation  Past Medical History:  Diagnosis Date   History of basal cell carcinoma (BCC) of skin      Physical Exam: General: The patient is alert and oriented x3 in no acute distress.  Dermatology: Skin is warm, dry and supple bilateral lower extremities. Negative for open lesions or macerations.  Vascular: Palpable pedal pulses bilaterally. No edema or erythema noted. Capillary refill within normal limits.  Neurological: Epicritic and protective threshold grossly intact bilaterally.   Musculoskeletal Exam: Negative for motion first MTPJ left.  Paresthesia noted with light touch.  There is also associated tenderness to palpation.  Elevatus of the first ray noted  Radiographic Exam:  Orthopedic plate and screws crossing the first MTPJ appear intact.  Stable.  No movement noted.  There is a faint line noted through the first MTPJ possibly consistent with a delayed union.  Assessment: 1.  Symptomatic iatrogenic elevatus first ray left 2.  Paresthesia with neuritis left foot surgery site   Plan of Care:  1. Patient evaluated. X-Rays reviewed.  2.  Explained to the patient that there is a component of elevatus to the first ray of the left foot that may be attributing to his imbalance. 3.  Unfortunately I do not see any surgical ability to correct for the deformity.  I believe the disadvantages to an additional surgery outweigh the advantages. 4.  Recommend good supportive shoes 5.  Return to clinic as needed      Edrick Kins, DPM [facility]  Dr. Edrick Kins, DPM    [address], [place] [address]                Office [phone]  Fax [phone]

## 2020-10-19 ENCOUNTER — Other Ambulatory Visit: Payer: Self-pay | Admitting: Family Medicine

## 2020-11-10 ENCOUNTER — Other Ambulatory Visit: Payer: Self-pay | Admitting: Family Medicine

## 2020-11-16 ENCOUNTER — Other Ambulatory Visit: Payer: Self-pay | Admitting: Family Medicine

## 2020-12-06 ENCOUNTER — Telehealth: Payer: Self-pay | Admitting: Family Medicine

## 2020-12-06 NOTE — Telephone Encounter (Signed)
Left message for patient to call back and schedule Medicare Annual Wellness Visit (AWV) in office.  ° °If not able to come in office, please offer to do virtually or by telephone.  Left office number and my jabber #[phone]. ° °Due for AWVI ° °Please schedule at anytime with Nurse Health Advisor. °  °

## 2021-02-14 ENCOUNTER — Other Ambulatory Visit: Payer: Self-pay | Admitting: Family Medicine

## 2021-02-28 NOTE — Progress Notes (Signed)
Subjective:   Justin Escobar is a 75 y.o. male who presents for an Initial Medicare Annual Wellness Visit.  Review of Systems     Cardiac Risk Factors include: advanced age (>83men, >54 women);male gender;dyslipidemia     Objective:    Today's Vitals   03/02/21 1542  BP: 128/68  Pulse: 82  Resp: 16  Temp: 98.4 F (36.9 C)  TempSrc: Oral  SpO2: 95%  Weight: 181 lb 6.4 oz (82.3 kg)  Height: 6\' 1"  (1.854 m)   Body mass index is 23.93 kg/m.  Advanced Directives 03/02/2021 05/19/2019 05/12/2019  Does Patient Have a Medical Advance Directive? No Yes Yes  Type of Advance Directive - - [place];Living will  Does patient want to make changes to medical advance directive? Yes (MAU/Ambulatory/Procedural Areas - Information given) - No - Patient declined    Current Medications (verified) Outpatient Encounter Medications as of 03/02/2021  Medication Sig   simvastatin (ZOCOR) 40 MG tablet Take 1 tablet by mouth once daily   fluticasone (FLOVENT HFA) 110 MCG/ACT inhaler Inhale 2 puffs into the lungs in the morning and at bedtime. Rinse mouth out after use.   PFIZER-BIONT COVID-19 VAC-TRIS SUSP injection  (Patient not taking: Reported on 03/02/2021)   terbinafine (LAMISIL) 250 MG tablet Take 250 mg by mouth daily. (Patient not taking: Reported on 03/02/2021)   No facility-administered encounter medications on file as of 03/02/2021.    Allergies (verified) 5-alpha reductase inhibitors and Latex   History: Past Medical History:  Diagnosis Date   History of basal cell carcinoma (BCC) of skin    Past Surgical History:  Procedure Laterality Date   ARTHRODESIS METATARSALPHALANGEAL JOINT (MTPJ) Left 05/19/2019   Procedure: LEFT 1ST METATARSALPHALANGEAL JOINT FUSION;  Surgeon: Newt Minion, MD;  Location: [facility];  Service: Orthopedics;  Laterality: Left;   MOHS SURGERY     Family History  Problem Relation Age of Onset   Alzheimer's disease Mother     Cancer Neg Hx    Social History   Socioeconomic History   Marital status: Married    Spouse name: Not on file   Number of children: Not on file   Years of education: Not on file   Highest education level: Not on file  Occupational History   Not on file  Tobacco Use   Smoking status: Former   Smokeless tobacco: Never  Substance and Sexual Activity   Alcohol use: Yes    Comment: 6-7 drinks per week   Drug use: Never   Sexual activity: Not on file  Other Topics Concern   Not on file  Social History Narrative   Not on file   Social Determinants of Health   Financial Resource Strain: Low Risk    Difficulty of Paying Living Expenses: Not hard at all  Food Insecurity: No Food Insecurity   Worried About Charity fundraiser in the Last Year: Never true   [place] in the Last Year: Never true  Transportation Needs: No Transportation Needs   Lack of Transportation (Medical): No   Lack of Transportation (Non-Medical): No  Physical Activity: Inactive   Days of Exercise per Week: 0 days   Minutes of Exercise per Session: 0 min  Stress: No Stress Concern Present   Feeling of Stress : Not at all  Social Connections: Socially Integrated   Frequency of Communication with Friends and Family: More than three times a week   Frequency of Social Gatherings  with Friends and Family: More than three times a week   Attends Religious Services: More than 4 times per year   Active Member of Clubs or Organizations: Yes   Attends Archivist Meetings: 1 to 4 times per year   Marital Status: Married    Tobacco Counseling Counseling given: Not Answered   Clinical Intake:  Pre-visit preparation completed: Yes        BMI - recorded: 23.93 Nutritional Status: BMI of 19-24  Normal Nutritional Risks: None Diabetes: No  How often do you need to have someone help you when you read instructions, pamphlets, or other written materials from your doctor or pharmacy?: 1 -  Never  Diabetic?No  Interpreter Needed?: No  Information entered by :: Caroleen Hamman LPN   Activities of Daily Living In your present state of health, do you have any difficulty performing the following activities: 03/02/2021 09/07/2020  Hearing? N N  Vision? N N  Difficulty concentrating or making decisions? N N  Walking or climbing stairs? N N  Dressing or bathing? N N  Doing errands, shopping? N N  Preparing Food and eating ? N -  Using the Toilet? N -  In the past six months, have you accidently leaked urine? N -  Do you have problems with loss of bowel control? N -  Managing your Medications? N -  Managing your Finances? N -  Housekeeping or managing your Housekeeping? N -  Some recent data might be hidden    Patient Care Team: Shelda Pal, DO as PCP - General (Family Medicine)  Indicate any recent Medical Services you may have received from other than [facility] providers in the past year (date may be approximate).     Assessment:   This is a routine wellness examination for Justin Escobar.  Hearing/Vision screen Hearing Screening - Comments:: No issues Vision Screening - Comments:: Last eye exam-2018  Dietary issues and exercise activities discussed: Current Exercise Habits: The patient has a physically strenuous job, but has no regular exercise apart from work., Exercise limited by: None identified   Goals Addressed             This Visit's Progress    Patient Stated       Continue Harmonica lessons       Depression Screen PHQ 2/9 Scores 03/02/2021 09/07/2020  PHQ - 2 Score 0 0    Fall Risk Fall Risk  03/02/2021 09/07/2020  Falls in the past year? 0 0  Number falls in past yr: 0 0  Injury with Fall? 0 0  Risk for fall due to : - No Fall Risks  Follow up Falls prevention discussed Falls evaluation completed    [place]:  Any stairs in or around the home? No  Home free of loose throw rugs in walkways, pet beds,  electrical cords, etc? Yes  Adequate lighting in your home to reduce risk of falls? Yes   ASSISTIVE DEVICES UTILIZED TO PREVENT FALLS:  Life alert? No  Use of a cane, walker or w/c? No  Grab bars in the bathroom? Yes  Shower chair or bench in shower? Yes  Elevated toilet seat or a handicapped toilet? No   TIMED UP AND GO:  Was the test performed? Yes .  Length of time to ambulate 10 feet: 10 sec.   Gait steady and fast without use of assistive device  Cognitive Function:Normal cognitive status assessed by direct observation by this Nurse Health  Advisor. No abnormalities found.          Immunizations Immunization History  Administered Date(s) Administered   Influenza-Unspecified 04/27/2015, 12/09/2018   PFIZER(Purple Top)SARS-COV-2 Vaccination 04/19/2019, 05/11/2019, 01/10/2020, 08/04/2020   Zoster Recombinat (Shingrix) 07/30/2019, 11/01/2019    TDAP status: Up to date  Flu Vaccine status: Up to date  Pneumococcal vaccine status: Up to date per patient. Specific dates unknown  Covid-19 vaccine status: Completed vaccines  Qualifies for Shingles Vaccine? No   Zostavax completed No   Shingrix Completed?: Yes  Screening Tests Health Maintenance  Topic Date Due   Pneumonia Vaccine 33+ Years old (1 - PCV) Never done   COVID-19 Vaccine (5 - Booster for Pfizer series) 09/29/2020   INFLUENZA VACCINE  10/24/2020   TETANUS/TDAP  11/15/2021   COLONOSCOPY (Pts 45-61yrs Insurance coverage will need to be confirmed)  02/02/2025   Hepatitis C Screening  Completed   Zoster Vaccines- Shingrix  Completed   HPV VACCINES  Aged Out    Health Maintenance  Health Maintenance Due  Topic Date Due   Pneumonia Vaccine 51+ Years old (1 - PCV) Never done   COVID-19 Vaccine (5 - Booster for Rodriguez Hevia series) 09/29/2020   INFLUENZA VACCINE  10/24/2020    Colorectal cancer screening: Type of screening: Colonoscopy. Completed 02/03/2015. Repeat every 10 years  Lung Cancer Screening:  (Low Dose CT Chest recommended if Age 68-80 years, 30 pack-year currently smoking OR have quit w/in 15years.) does not qualify.     Additional Screening:  Hepatitis C Screening: Completed 01/13/2019  Vision Screening: Recommended annual ophthalmology exams for early detection of glaucoma and other disorders of the eye. Is the patient up to date with their annual eye exam?  No  Who is the provider or what is the name of the office in which the patient attends annual eye exams? Unknown-patient advised to make an appt.   Dental Screening: Recommended annual dental exams for proper oral hygiene  Community Resource Referral / Chronic Care Management: CRR required this visit?  No   CCM required this visit?  No      Plan:     I have personally reviewed and noted the following in the patient's chart:   Medical and social history Use of alcohol, tobacco or illicit drugs  Current medications and supplements including opioid prescriptions. Patient is not currently taking opioid prescriptions. Functional ability and status Nutritional status Physical activity Advanced directives List of other physicians Hospitalizations, surgeries, and ER visits in previous 12 months Vitals Screenings to include cognitive, depression, and falls Referrals and appointments  In addition, I have reviewed and discussed with patient certain preventive protocols, quality metrics, and best practice recommendations. A written personalized care plan for preventive services as well as general preventive health recommendations were provided to patient.   Patient would like to access avs on mychart.  Marta Antu, LPN   [id number]/[id number]  Nurse Health Advisor  Nurse Notes: None

## 2021-03-02 ENCOUNTER — Telehealth: Payer: Self-pay

## 2021-03-02 ENCOUNTER — Ambulatory Visit (INDEPENDENT_AMBULATORY_CARE_PROVIDER_SITE_OTHER): Payer: Medicare HMO

## 2021-03-02 VITALS — BP 128/68 | HR 82 | Temp 98.4°F | Resp 16 | Ht 73.0 in | Wt 181.4 lb

## 2021-03-02 DIAGNOSIS — Z0000 Encounter for general adult medical examination without abnormal findings: Secondary | ICD-10-CM

## 2021-03-02 NOTE — Telephone Encounter (Signed)
Patient is requesting a referral to an allergist for his chronic nasal congestion.

## 2021-03-02 NOTE — Patient Instructions (Signed)
Justin Escobar , Thank you for taking time to come for your Medicare Wellness Visit. I appreciate your ongoing commitment to your health goals. Please review the following plan we discussed and let me know if I can assist you in the future.   Screening recommendations/referrals: Colonoscopy: Per our conversation today, you will bring a copy of your last colonoscopy report to the office for review. Recommended yearly ophthalmology/optometry visit for glaucoma screening and checkup Recommended yearly dental visit for hygiene and checkup  Vaccinations: Influenza vaccine: Up to date Pneumococcal vaccine: Up to date Tdap vaccine: Up to date Shingles vaccine: Completed vaccines   Covid-19: Up to date  Advanced directives: Information given today  Conditions/risks identified: See problem list  Next appointment: Follow up in one year for your annual wellness visit. 03/05/2022 @ 3:40  Preventive Care 65 Years and Older, Male Preventive care refers to lifestyle choices and visits with your health care provider that can promote health and wellness. What does preventive care include? A yearly physical exam. This is also called an annual well check. Dental exams once or twice a year. Routine eye exams. Ask your health care provider how often you should have your eyes checked. Personal lifestyle choices, including: Daily care of your teeth and gums. Regular physical activity. Eating a healthy diet. Avoiding tobacco and drug use. Limiting alcohol use. Practicing safe sex. Taking low doses of aspirin every day. Taking vitamin and mineral supplements as recommended by your health care provider. What happens during an annual well check? The services and screenings done by your health care provider during your annual well check will depend on your age, overall health, lifestyle risk factors, and family history of disease. Counseling  Your health care provider may ask you questions about  your: Alcohol use. Tobacco use. Drug use. Emotional well-being. Home and relationship well-being. Sexual activity. Eating habits. History of falls. Memory and ability to understand (cognition). Work and work Statistician. Screening  You may have the following tests or measurements: Height, weight, and BMI. Blood pressure. Lipid and cholesterol levels. These may be checked every 5 years, or more frequently if you are over 74 years old. Skin check. Lung cancer screening. You may have this screening every year starting at age 82 if you have a 30-pack-year history of smoking and currently smoke or have quit within the past 15 years. Fecal occult blood test (FOBT) of the stool. You may have this test every year starting at age 62. Flexible sigmoidoscopy or colonoscopy. You may have a sigmoidoscopy every 5 years or a colonoscopy every 10 years starting at age 77. Prostate cancer screening. Recommendations will vary depending on your family history and other risks. Hepatitis C blood test. Hepatitis B blood test. Sexually transmitted disease (STD) testing. Diabetes screening. This is done by checking your blood sugar (glucose) after you have not eaten for a while (fasting). You may have this done every 1-3 years. Abdominal aortic aneurysm (AAA) screening. You may need this if you are a current or former smoker. Osteoporosis. You may be screened starting at age 31 if you are at high risk. Talk with your health care provider about your test results, treatment options, and if necessary, the need for more tests. Vaccines  Your health care provider may recommend certain vaccines, such as: Influenza vaccine. This is recommended every year. Tetanus, diphtheria, and acellular pertussis (Tdap, Td) vaccine. You may need a Td booster every 10 years. Zoster vaccine. You may need this after age 108. Pneumococcal 13-valent  conjugate (PCV13) vaccine. One dose is recommended after age 3. Pneumococcal  polysaccharide (PPSV23) vaccine. One dose is recommended after age 20. Talk to your health care provider about which screenings and vaccines you need and how often you need them. This information is not intended to replace advice given to you by your health care provider. Make sure you discuss any questions you have with your health care provider. Document Released: 04/08/2015 Document Revised: 11/30/2015 Document Reviewed: 01/11/2015 Elsevier Interactive Patient Education  2017 [place] Prevention in the Home Falls can cause injuries. They can happen to people of all ages. There are many things you can do to make your home safe and to help prevent falls. What can I do on the outside of my home? Regularly fix the edges of walkways and driveways and fix any cracks. Remove anything that might make you trip as you walk through a door, such as a raised step or threshold. Trim any bushes or trees on the path to your home. Use bright outdoor lighting. Clear any walking paths of anything that might make someone trip, such as rocks or tools. Regularly check to see if handrails are loose or broken. Make sure that both sides of any steps have handrails. Any raised decks and porches should have guardrails on the edges. Have any leaves, snow, or ice cleared regularly. Use sand or salt on walking paths during winter. Clean up any spills in your garage right away. This includes oil or grease spills. What can I do in the bathroom? Use night lights. Install grab bars by the toilet and in the tub and shower. Do not use towel bars as grab bars. Use non-skid mats or decals in the tub or shower. If you need to sit down in the shower, use a plastic, non-slip stool. Keep the floor dry. Clean up any water that spills on the floor as soon as it happens. Remove soap buildup in the tub or shower regularly. Attach bath mats securely with double-sided non-slip rug tape. Do not have throw rugs and other  things on the floor that can make you trip. What can I do in the bedroom? Use night lights. Make sure that you have a light by your bed that is easy to reach. Do not use any sheets or blankets that are too big for your bed. They should not hang down onto the floor. Have a firm chair that has side arms. You can use this for support while you get dressed. Do not have throw rugs and other things on the floor that can make you trip. What can I do in the kitchen? Clean up any spills right away. Avoid walking on wet floors. Keep items that you use a lot in easy-to-reach places. If you need to reach something above you, use a strong step stool that has a grab bar. Keep electrical cords out of the way. Do not use floor polish or wax that makes floors slippery. If you must use wax, use non-skid floor wax. Do not have throw rugs and other things on the floor that can make you trip. What can I do with my stairs? Do not leave any items on the stairs. Make sure that there are handrails on both sides of the stairs and use them. Fix handrails that are broken or loose. Make sure that handrails are as long as the stairways. Check any carpeting to make sure that it is firmly attached to the stairs. Fix any carpet that  is loose or worn. Avoid having throw rugs at the top or bottom of the stairs. If you do have throw rugs, attach them to the floor with carpet tape. Make sure that you have a light switch at the top of the stairs and the bottom of the stairs. If you do not have them, ask someone to add them for you. What else can I do to help prevent falls? Wear shoes that: Do not have high heels. Have rubber bottoms. Are comfortable and fit you well. Are closed at the toe. Do not wear sandals. If you use a stepladder: Make sure that it is fully opened. Do not climb a closed stepladder. Make sure that both sides of the stepladder are locked into place. Ask someone to hold it for you, if possible. Clearly  mark and make sure that you can see: Any grab bars or handrails. First and last steps. Where the edge of each step is. Use tools that help you move around (mobility aids) if they are needed. These include: Canes. Walkers. Scooters. Crutches. Turn on the lights when you go into a dark area. Replace any light bulbs as soon as they burn out. Set up your furniture so you have a clear path. Avoid moving your furniture around. If any of your floors are uneven, fix them. If there are any pets around you, be aware of where they are. Review your medicines with your doctor. Some medicines can make you feel dizzy. This can increase your chance of falling. Ask your doctor what other things that you can do to help prevent falls. This information is not intended to replace advice given to you by your health care provider. Make sure you discuss any questions you have with your health care provider. Document Released: 01/06/2009 Document Revised: 08/18/2015 Document Reviewed: 04/16/2014 Elsevier Interactive Patient Education  2017 [employer].

## 2021-03-03 ENCOUNTER — Other Ambulatory Visit: Payer: Self-pay | Admitting: Family Medicine

## 2021-03-03 DIAGNOSIS — R0981 Nasal congestion: Secondary | ICD-10-CM

## 2021-03-03 NOTE — Telephone Encounter (Signed)
done

## 2021-03-15 DIAGNOSIS — J069 Acute upper respiratory infection, unspecified: Secondary | ICD-10-CM | POA: Diagnosis not present

## 2021-03-15 DIAGNOSIS — R5383 Other fatigue: Secondary | ICD-10-CM | POA: Diagnosis not present

## 2021-03-15 DIAGNOSIS — J029 Acute pharyngitis, unspecified: Secondary | ICD-10-CM | POA: Diagnosis not present

## 2021-03-15 DIAGNOSIS — Z03818 Encounter for observation for suspected exposure to other biological agents ruled out: Secondary | ICD-10-CM | POA: Diagnosis not present

## 2021-03-15 DIAGNOSIS — R197 Diarrhea, unspecified: Secondary | ICD-10-CM | POA: Diagnosis not present

## 2021-03-29 ENCOUNTER — Encounter: Payer: Self-pay | Admitting: Family Medicine

## 2021-04-17 ENCOUNTER — Encounter: Payer: Self-pay | Admitting: Family Medicine

## 2021-04-17 ENCOUNTER — Ambulatory Visit (INDEPENDENT_AMBULATORY_CARE_PROVIDER_SITE_OTHER): Payer: Medicare HMO | Admitting: Family Medicine

## 2021-04-17 VITALS — BP 138/68 | HR 73 | Temp 98.3°F | Ht 73.0 in | Wt 182.4 lb

## 2021-04-17 DIAGNOSIS — E785 Hyperlipidemia, unspecified: Secondary | ICD-10-CM

## 2021-04-17 DIAGNOSIS — Z0000 Encounter for general adult medical examination without abnormal findings: Secondary | ICD-10-CM | POA: Diagnosis not present

## 2021-04-17 NOTE — Patient Instructions (Signed)
Keep the diet clean and stay active.  Give us 2-3 business days to get the results of your labs back.   Let us know if you need anything.  

## 2021-04-17 NOTE — Progress Notes (Signed)
Chief Complaint  Patient presents with   Annual Exam    Well Male Justin Escobar is here for a complete physical.   His last physical was >1 year ago.  Current diet: in general, a "healthy" diet.   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 No. Seat belt? Yes.   Advanced directive? Yes  Health maintenance Shingrix- Yes Colonoscopy- Yes Tetanus- Yes Hep C- Yes Pneumonia vaccine- Yes  Past Medical History:  Diagnosis Date   History of basal cell carcinoma (BCC) of skin      Past Surgical History:  Procedure Laterality Date   ARTHRODESIS METATARSALPHALANGEAL JOINT (MTPJ) Left 05/19/2019   Procedure: LEFT 1ST METATARSALPHALANGEAL JOINT FUSION;  Surgeon: Newt Minion, MD;  Location: [facility];  Service: Orthopedics;  Laterality: Left;   MOHS SURGERY      Medications  Current Outpatient Medications on File Prior to Visit  Medication Sig Dispense Refill   PFIZER-BIONT COVID-19 VAC-TRIS SUSP injection      simvastatin (ZOCOR) 40 MG tablet Take 1 tablet by mouth once daily 90 tablet 0    Allergies Allergies  Allergen Reactions   5-Alpha Reductase Inhibitors    Latex Rash    Family History Family History  Problem Relation Age of Onset   Alzheimer's disease Mother    Cancer Neg Hx     Review of Systems: Constitutional:  no fevers Eye:  no recent significant change in vision Ears:  No changes in hearing Nose/Mouth/Throat:  no complaints of nasal congestion, no sore throat Cardiovascular: no chest pain Respiratory:  No shortness of breath Gastrointestinal:  No change in bowel habits GU:  No frequency Integumentary:  no abnormal skin lesions reported Neurologic:  no headaches Endocrine:  denies unexplained weight changes  Exam BP 138/68    Pulse 73    Temp 98.3 F (36.8 C) (Oral)    Ht 6\' 1"  (1.854 m)    Wt 182 lb 6 oz (82.7 kg)    SpO2 95%    BMI 24.06 kg/m   General:  well developed, well nourished, in no apparent distress Skin:  no significant moles, warts, or growths Head:  no masses, lesions, or tenderness Eyes:  pupils equal and round, sclera anicteric without injection Ears:  canals without lesions, TMs shiny without retraction, no obvious effusion, no erythema Nose:  nares patent, septum midline, mucosa normal Throat/Pharynx:  lips and gingiva without lesion; tongue and uvula midline; non-inflamed pharynx; no exudates or postnasal drainage Lungs:  clear to auscultation, breath sounds equal bilaterally, no respiratory distress Cardio:  regular rate and rhythm, no LE edema or bruits Rectal: Deferred GI: BS+, S, NT, ND, no masses or organomegaly Musculoskeletal:  symmetrical muscle groups noted without atrophy or deformity Neuro:  gait normal; deep tendon reflexes normal and symmetric Psych: well oriented with normal range of affect and appropriate judgment/insight  Assessment and Plan  Well adult exam  Hyperlipidemia, unspecified hyperlipidemia type - Plan: Comprehensive metabolic panel, Lipid panel   Well 76 y.o. male. Counseled on diet and exercise. Other orders as above. Follow up in 6 mo or prn.  The patient voiced understanding and agreement to the plan.  [place], DO 04/17/21 2:57 PM

## 2021-04-18 LAB — COMPREHENSIVE METABOLIC PANEL
ALT: 18 U/L (ref 0–53)
AST: 24 U/L (ref 0–37)
Albumin: 4.5 g/dL (ref 3.5–5.2)
Alkaline Phosphatase: 60 U/L (ref 39–117)
BUN: 16 mg/dL (ref 6–23)
CO2: 28 mEq/L (ref 19–32)
Calcium: 9.3 mg/dL (ref 8.4–10.5)
Chloride: 104 mEq/L (ref 96–112)
Creatinine, Ser: 0.93 mg/dL (ref 0.40–1.50)
GFR: 80.17 mL/min (ref >60.00)
Glucose, Bld: 82 mg/dL (ref 70–99)
Potassium: 4.2 mEq/L (ref 3.5–5.1)
Sodium: 139 mEq/L (ref 135–145)
Total Bilirubin: 0.7 mg/dL (ref 0.2–1.2)
Total Protein: 6.7 g/dL (ref 6.0–8.3)

## 2021-04-18 LAB — LIPID PANEL
Cholesterol: 139 mg/dL (ref 0–200)
HDL: 66.6 mg/dL (ref >39.00)
LDL Cholesterol: 53 mg/dL (ref 0–99)
NonHDL: 72.46
Total CHOL/HDL Ratio: 2
Triglycerides: 95 mg/dL (ref 0.0–149.0)
VLDL: 19 mg/dL (ref 0.0–40.0)

## 2021-04-27 ENCOUNTER — Other Ambulatory Visit: Payer: Self-pay

## 2021-04-27 ENCOUNTER — Ambulatory Visit (INDEPENDENT_AMBULATORY_CARE_PROVIDER_SITE_OTHER): Payer: Medicare HMO | Admitting: Allergy & Immunology

## 2021-04-27 VITALS — BP 128/82 | HR 78 | Temp 98.2°F | Resp 16 | Ht 73.0 in | Wt 184.0 lb

## 2021-04-27 DIAGNOSIS — J3089 Other allergic rhinitis: Secondary | ICD-10-CM

## 2021-04-27 DIAGNOSIS — J310 Chronic rhinitis: Secondary | ICD-10-CM

## 2021-04-27 DIAGNOSIS — J302 Other seasonal allergic rhinitis: Secondary | ICD-10-CM

## 2021-04-27 MED ORDER — FLUTICASONE PROPIONATE 50 MCG/ACT NA SUSP: 1.0000 | Freq: Every day | NASAL | 2 refills | Status: DC

## 2021-04-27 MED ORDER — OLOPATADINE HCL 0.6 % NA SOLN: NASAL | 2 refills | Status: DC

## 2021-04-27 NOTE — Patient Instructions (Addendum)
1. Chronic rhinitis - Testing today showed: grasses and indoor molds. - Copy of test results provided.  - Avoidance measures provided. - Start taking: Flonase (fluticasone) one spray per nostril daily (AIM FOR EAR ON EACH SIDE) and Patanase (olopatadine) two sprays per nostril 1-2 times daily as needed - You can use an extra dose of the antihistamine, if needed, for breakthrough symptoms.  - Consider nasal saline rinses 1-2 times daily to remove allergens from the nasal cavities as well as help with mucous clearance (this is especially helpful to do before the nasal sprays are given) - Call Justin Escobar with an update in a couple of weeks.   2. Return in about 3 months (around 07/25/2021).    Please inform Justin Escobar of any Emergency Department visits, hospitalizations, or changes in symptoms. Call Justin Escobar before going to the ED for breathing or allergy symptoms since we might be able to fit you in for a sick visit. Feel free to contact Justin Escobar anytime with any questions, problems, or concerns.  It was a pleasure to meet you today!  Websites that have reliable patient information: 1. American Academy of Asthma, Allergy, and Immunology: www.aaaai.org 2. Food Allergy Research and Education (FARE): foodallergy.org 3. Mothers of Asthmatics: http://www.asthmacommunitynetwork.org 4. American College of Allergy, Asthma, and Immunology: www.acaai.org   COVID-19 Vaccine Information can be found at: [email or web address].uk For questions related to vaccine distribution or appointments, please email [email or web address] or call [phone].   We realize that you might be concerned about having an allergic reaction to the COVID19 vaccines. To help with that concern, WE ARE OFFERING THE COVID19 VACCINES IN OUR OFFICE! Ask the front desk for dates!     Like Justin Escobar on [employer] and Instagram for our latest updates!      A healthy democracy works best when [employer]  participate! Make sure you are registered to vote! If you have moved or changed any of your contact information, you will need to get this updated before voting!  In some cases, you MAY be able to register to vote online: [email or web address]      You can buy saline nose drops at a pharmacy, or you can make your own saline solution:  1. Add 1 cup (240 mL) distilled water to a clean container. If you use tap water, boil it first to sterilize it, and then let it cool until it is lukewarm.  2. Add 0.5 tsp (2.5 g) salt to the water. 3. Add 0.5 tsp (2.5 g) baking soda.   Airborne Adult Perc - 04/27/21 1503     Time Antigen Placed 1503    Allergen Manufacturer Lavella Hammock    Location Back    Number of Test 59    1. Control-Buffer 50% Glycerol Negative    2. Control-Histamine 1 mg/ml 2+    3. Albumin saline Negative    4. [place] Negative    5. [place] Negative    6. Johnson Negative    7. [place] Blue Negative    8. Meadow Fescue Negative    9. Perennial Rye Negative    10. Sweet Vernal Negative    11. Timothy Negative    12. Cocklebur Negative    13. Burweed Marshelder Negative    14. Ragweed, short Negative    15. Ragweed, Giant Negative    16. Plantain,  English Negative    17. Lamb's Quarters Negative    18. Sheep Sorrell Negative    19. Rough Pigweed Negative    20. [place] Elder, Rough Negative  21. Mugwort, Common Negative    22. Ash mix Negative    23. Birch mix Negative    24. Beech American Negative    25. Box, Elder Negative    26. Cedar, red Negative    27. Cottonwood, [place] Negative    28. Elm mix Negative    29. Hickory Negative    30. Maple mix Negative    31. Oak, [place] mix Negative    32. Pecan Pollen Negative    33. Pine mix Negative    34. Sycamore Eastern Negative    35. [place], Black Pollen Negative    36. Alternaria alternata Negative    37. Cladosporium Herbarum Negative    38. Aspergillus mix Negative    39.  Penicillium mix Negative    40. Bipolaris sorokiniana (Helminthosporium) Negative    41. Drechslera spicifera (Curvularia) Negative    42. Mucor plumbeus Negative    43. Fusarium moniliforme Negative    44. Aureobasidium pullulans (pullulara) Negative    45. Rhizopus oryzae Negative    46. Botrytis cinera Negative    47. Epicoccum nigrum Negative    48. Phoma betae Negative    49. Candida Albicans Negative    50. Trichophyton mentagrophytes Negative    51. Mite, D Farinae  5,000 AU/ml Negative    52. Mite, D Pteronyssinus  5,000 AU/ml Negative    53. Cat Hair 10,000 BAU/ml Negative    54.  Dog Epithelia Negative    55. Mixed Feathers Negative    56. Horse Epithelia Negative    57. Cockroach, German Negative    58. Mouse Negative    59. Tobacco Leaf Negative             Intradermal - 04/27/21 1614     Time Antigen Placed 0400    Allergen Manufacturer Lavella Hammock    Location Arm    Number of Test 15    Intradermal Select    Control Negative    [place] 2+    Johnson Negative    7 Grass Negative    Ragweed mix Negative    Weed mix Negative    Tree mix Negative    Mold 1 Negative    Mold 2 2+    Mold 3 Negative    Mold 4 4+    Cat Negative    Dog Negative    Cockroach Negative    Mite mix Negative             Reducing Pollen Exposure  The American Academy of Allergy, Asthma and Immunology suggests the following steps to reduce your exposure to pollen during allergy seasons.    Do not hang sheets or clothing out to dry; pollen may collect on these items. Do not mow lawns or spend time around freshly cut grass; mowing stirs up pollen. Keep windows closed at night.  Keep car windows closed while driving. Minimize morning activities outdoors, a time when pollen counts are usually at their highest. Stay indoors as much as possible when pollen counts or humidity is high and on windy days when pollen tends to remain in the air longer. Use air conditioning when possible.   Many air conditioners have filters that trap the pollen spores. Use a HEPA room air filter to remove pollen form the indoor air you breathe.  Control of Mold Allergen   Mold and fungi can grow on a variety of surfaces provided certain temperature and moisture conditions exist.  Outdoor molds grow on plants,  decaying vegetation and soil.  The major outdoor mold, Alternaria and Cladosporium, are found in very high numbers during hot and dry conditions.  Generally, a late Summer - Fall peak is seen for common outdoor fungal spores.  Rain will temporarily lower outdoor mold spore count, but counts rise rapidly when the rainy period ends.  The most important indoor molds are Aspergillus and Penicillium.  Dark, humid and poorly ventilated basements are ideal sites for mold growth.  The next most common sites of mold growth are the bathroom and the kitchen.   Indoor (Perennial) Mold Control   Positive indoor molds via skin testing: Aspergillus, Penicillium, Fusarium, Aureobasidium (Pullulara), and Rhizopus  Maintain humidity below 50%. Clean washable surfaces with 5% bleach solution. Remove sources e.g. contaminated carpets.

## 2021-04-27 NOTE — Progress Notes (Signed)
NEW PATIENT  Date of Service/Encounter:  04/27/21  Consult requested by: Shelda Pal, DO   Assessment:   Seasonal and perennial allergic rhinitis  History of Ramsey Hunt syndrome   History of wheezing/coughing with episodes of bronchitis (did not do spirometry today)  Plan/Recommendations:    1. Chronic rhinitis - Testing today showed: grasses and indoor molds. - Copy of test results provided.  - Avoidance measures provided. - Start taking: Flonase (fluticasone) one spray per nostril daily (AIM FOR EAR ON EACH SIDE) and Patanase (olopatadine) two sprays per nostril 1-2 times daily as needed - You can use an extra dose of the antihistamine, if needed, for breakthrough symptoms.  - Consider nasal saline rinses 1-2 times daily to remove allergens from the nasal cavities as well as help with mucous clearance (this is especially helpful to do before the nasal sprays are given) - Call us with an update in a couple of weeks.   2. Return in about 3 months (around 07/25/2021).    This note in its entirety was forwarded to the Provider who requested this consultation.  Subjective:   Justin Escobar is a 76 y.o. male presenting today for evaluation of  Chief Complaint  Patient presents with   Establish Care   Nasal Congestion    Justin Escobar has a history of the following: Patient Active Problem List   Diagnosis Date Noted   Seasonal and perennial allergic rhinitis 04/28/2021   Hallux rigidus, left foot    Low serum vitamin B12 01/13/2019   History of skin cancer 07/09/2018   Hyperlipidemia 07/09/2018    History obtained from: chart review and patient.  Bonnielee Haff was referred by Shelda Pal, DO.     Justin Escobar is a 76 y.o. male presenting for an evaluation of chronic nasal congestion .   He has congestion right when he gets up in the morning. He will settle down and it starts over again. Then he goes to work and it seems to calm down. He is  there all morning. He goes home for lunch and then it starts over again. It started years ago. He moved up here from [place] 4-5 years ago. He does miss [place] during the terribly cold months here in [place]. He lived in the [place] the [place]. Symptoms are particularly worse in this time of the year. Holidays into January and February were bad.   He does not use any medications for his nasal issues. He did try some over the counter medications without much relief early on.   He came back from [place] and he had some left sided facial dropping. This was diagnosed as Ramsey Hunt syndrome. The right side of his face was compromised. He got more of this back over time. When he eats, he has tears that come from that side. This was 4-5 years ago.  He did have testing done around 4 years ago when he was in [place]. This was "inconclusive". Symptoms have overall gotten worse. He is never treated with prednisone for these issues, but for other issues.     Asthma/Respiratory Symptom History: He did have an inhaler for a period of time in the last year or so.  He did have some Tessalon pearls at one point for a cold like symptoms.   Otherwise, there is no history of other atopic diseases, including food allergies, drug allergies, stinging insect allergies, eczema, urticaria, or contact dermatitis. There is no significant infectious history. Vaccinations are  up to date.    Past Medical History: Patient Active Problem List   Diagnosis Date Noted   Seasonal and perennial allergic rhinitis 04/28/2021   Hallux rigidus, left foot    Low serum vitamin B12 01/13/2019   History of skin cancer 07/09/2018   Hyperlipidemia 07/09/2018    Medication List:  Allergies as of 04/27/2021       Reactions   5-alpha Reductase Inhibitors    Latex Rash        Medication List        Accurate as of April 27, 2021 11:59 PM. If you have any questions, ask your nurse or doctor.           fluticasone 50 MCG/ACT nasal spray Commonly known as: FLONASE Place 1 spray into both nostrils daily. Started by: Valentina Shaggy, MD   Olopatadine HCl 0.6 % Soln Place two sprays per nostril 1-2 times daily as needed Started by: Valentina Shaggy, MD   Pfizer-BioNT COVID-19 Vac-TriS Susp injection Generic drug: COVID-19 mRNA Vac-TriS (Pfizer)   simvastatin 40 MG tablet Commonly known as: ZOCOR Take 1 tablet by mouth once daily        Birth History: non-contributory  Developmental History: non-contributory  Past Surgical History: Past Surgical History:  Procedure Laterality Date   ARTHRODESIS METATARSALPHALANGEAL JOINT (MTPJ) Left 05/19/2019   Procedure: LEFT 1ST METATARSALPHALANGEAL JOINT FUSION;  Surgeon: Newt Minion, MD;  Location: [facility];  Service: Orthopedics;  Laterality: Left;   MOHS SURGERY       Family History: Family History  Problem Relation Age of Onset   Alzheimer's disease Mother    Cancer Neg Hx      Social History: Dawsyn lives at home with his wife. They live in a condo. There is no smoking and no pets. He currently works at a company that [employer]", although not in the grab a candy bar realm. 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 They communicate with suppliers to order more inventory. He previously worked as an Chief Financial Officer. His daughter works for [employer] and does a lot of traveling. She has worked there for a number and years and seems to like it.    Review of Systems  Constitutional: Negative.  Negative for fever, malaise/fatigue and weight loss.  HENT: Negative.  Negative for congestion, ear discharge and ear pain.   Eyes:  Negative for pain, discharge and redness.  Respiratory:  Negative for cough, sputum production, shortness of breath and wheezing.   Cardiovascular: Negative.  Negative for chest pain and palpitations.   Gastrointestinal:  Negative for abdominal pain, constipation, diarrhea, heartburn, nausea and vomiting.  Skin: Negative.  Negative for itching and rash.  Neurological:  Negative for dizziness and headaches.  Endo/Heme/Allergies:  Negative for environmental allergies. Does not bruise/bleed easily.      Objective:   Blood pressure 128/82, pulse 78, temperature 98.2 F (36.8 C), resp. rate 16, height 6\' 1"  (1.854 m), weight 184 lb (83.5 kg), SpO2 98 %. Body mass index is 24.28 kg/m.   Physical Exam:   Physical Exam Vitals reviewed.  Constitutional:      Appearance: He is well-developed.  HENT:     Head: Normocephalic and atraumatic.     Right Ear: Tympanic membrane, ear canal and external ear normal. No drainage, swelling or tenderness. Tympanic membrane is not injected, scarred, erythematous, retracted or bulging.     Left Ear:  Tympanic membrane, ear canal and external ear normal. No drainage, swelling or tenderness. Tympanic membrane is not injected, scarred, erythematous, retracted or bulging.     Nose: No nasal deformity, septal deviation, mucosal edema or rhinorrhea.     Right Turbinates: Enlarged, swollen and pale.     Left Turbinates: Enlarged, swollen and pale.     Right Sinus: No maxillary sinus tenderness or frontal sinus tenderness.     Left Sinus: No maxillary sinus tenderness or frontal sinus tenderness.     Comments: No nasal polyps. No epistaxis noted.     Mouth/Throat:     Mouth: Mucous membranes are not pale and not dry.     Pharynx: Uvula midline.  Eyes:     General:        Right eye: No discharge.        Left eye: No discharge.     Conjunctiva/sclera: Conjunctivae normal.     Right eye: Right conjunctiva is not injected. No chemosis.    Left eye: Left conjunctiva is not injected. No chemosis.    Pupils: Pupils are equal, round, and reactive to light.  Cardiovascular:     Rate and Rhythm: Normal rate and regular rhythm.     Heart sounds: Normal heart  sounds.  Pulmonary:     Effort: Pulmonary effort is normal. No tachypnea, accessory muscle usage or respiratory distress.     Breath sounds: Normal breath sounds. No wheezing, rhonchi or rales.  Chest:     Chest wall: No tenderness.  Abdominal:     Tenderness: There is no abdominal tenderness. There is no guarding or rebound.  Lymphadenopathy:     Head:     Right side of head: No submandibular, tonsillar or occipital adenopathy.     Left side of head: No submandibular, tonsillar or occipital adenopathy.     Cervical: No cervical adenopathy.  Skin:    Coloration: Skin is not pale.     Findings: No abrasion, erythema, petechiae or rash. Rash is not papular, urticarial or vesicular.  Neurological:     Mental Status: He is alert.  Psychiatric:        Behavior: Behavior is cooperative.     Diagnostic studies:   Allergy Studies:    Airborne Adult Perc - 04/27/21 1503     Time Antigen Placed 1503    Allergen Manufacturer Lavella Hammock    Location Back    Number of Test 59    1. Control-Buffer 50% Glycerol Negative    2. Control-Histamine 1 mg/ml 2+    3. Albumin saline Negative    4. [place] Negative    5. [place] Negative    6. [place] Negative    7. [place] Blue Negative    8. Meadow Fescue Negative    9. Perennial Rye Negative    10. Sweet Vernal Negative    11. Timothy Negative    12. Cocklebur Negative    13. Burweed Marshelder Negative    14. Ragweed, short Negative    15. Ragweed, Giant Negative    16. Plantain,  English Negative    17. Lamb's Quarters Negative    18. Sheep Sorrell Negative    19. Rough Pigweed Negative    20. Marsh Elder, Rough Negative    21. Mugwort, Common Negative    22. Ash mix Negative    23. Birch mix Negative    24. Beech American Negative    25. Box, Elder Negative    26. Cedar, red Negative  27. Cottonwood, Eastern Negative    28. Elm mix Negative    29. Hickory Negative    30. Maple mix Negative    31. Oak, [place] mix Negative     32. Pecan Pollen Negative    33. Pine mix Negative    34. Sycamore Eastern Negative    35. [place], Black Pollen Negative    36. Alternaria alternata Negative    37. Cladosporium Herbarum Negative    38. Aspergillus mix Negative    39. Penicillium mix Negative    40. Bipolaris sorokiniana (Helminthosporium) Negative    41. Drechslera spicifera (Curvularia) Negative    42. Mucor plumbeus Negative    43. Fusarium moniliforme Negative    44. Aureobasidium pullulans (pullulara) Negative    45. Rhizopus oryzae Negative    46. Botrytis cinera Negative    47. Epicoccum nigrum Negative    48. Phoma betae Negative    49. Candida Albicans Negative    50. Trichophyton mentagrophytes Negative    51. Mite, D Farinae  5,000 AU/ml Negative    52. Mite, D Pteronyssinus  5,000 AU/ml Negative    53. Cat Hair 10,000 BAU/ml Negative    54.  Dog Epithelia Negative    55. Mixed Feathers Negative    56. Horse Epithelia Negative    57. Cockroach, German Negative    58. Mouse Negative    59. Tobacco Leaf Negative             Intradermal - 04/27/21 1614     Time Antigen Placed 0400    Allergen Manufacturer Lavella Hammock    Location Arm    Number of Test 15    Intradermal Select    Control Negative    [place] 2+    [place] Negative    7 Grass Negative    Ragweed mix Negative    Weed mix Negative    Tree mix Negative    Mold 1 Negative    Mold 2 2+    Mold 3 Negative    Mold 4 4+    Cat Negative    Dog Negative    Cockroach Negative    Mite mix Negative             Allergy testing results were read and interpreted by myself, documented by clinical staff.         Salvatore Marvel, MD [facility]

## 2021-04-28 ENCOUNTER — Encounter: Payer: Self-pay | Admitting: Allergy & Immunology

## 2021-04-28 DIAGNOSIS — J310 Chronic rhinitis: Secondary | ICD-10-CM | POA: Insufficient documentation

## 2021-04-28 DIAGNOSIS — J3089 Other allergic rhinitis: Secondary | ICD-10-CM | POA: Insufficient documentation

## 2021-04-28 DIAGNOSIS — J302 Other seasonal allergic rhinitis: Secondary | ICD-10-CM | POA: Insufficient documentation

## 2021-05-14 ENCOUNTER — Encounter: Payer: Self-pay | Admitting: Family Medicine

## 2021-05-16 ENCOUNTER — Encounter: Payer: Self-pay | Admitting: Family Medicine

## 2021-05-16 ENCOUNTER — Ambulatory Visit (INDEPENDENT_AMBULATORY_CARE_PROVIDER_SITE_OTHER): Payer: Medicare HMO | Admitting: Family Medicine

## 2021-05-16 VITALS — BP 122/70 | HR 80 | Temp 98.4°F | Ht 73.0 in | Wt 182.0 lb

## 2021-05-16 DIAGNOSIS — R195 Other fecal abnormalities: Secondary | ICD-10-CM | POA: Diagnosis not present

## 2021-05-16 DIAGNOSIS — R109 Unspecified abdominal pain: Secondary | ICD-10-CM | POA: Diagnosis not present

## 2021-05-16 MED ORDER — DICYCLOMINE HCL 10 MG PO CAPS: ORAL_CAPSULE | ORAL | 0 refills | Status: DC

## 2021-05-16 NOTE — Progress Notes (Signed)
Chief Complaint  Patient presents with   Follow-up    Justin Escobar is here for lower abdominal pain.  Duration: a few months Nighttime awakenings? No Bleeding? No Weight loss? No Crampy type of pain.  Diet is stable, mainly plant based.  Associated symptoms:  gas, diarrhea, incomplete emptying, fatigue Denies: fever, nausea, and vomiting, urinary issues Treatment to date: none Stress levels are not an issue.   Past Medical History:  Diagnosis Date   History of basal cell carcinoma (BCC) of skin     BP 122/70    Pulse 80    Temp 98.4 F (36.9 C) (Oral)    Ht 6\' 1"  (1.854 m)    Wt 182 lb (82.6 kg)    SpO2 94%    BMI 24.01 kg/m  Gen.: Awake, alert, appears stated age 89: Mucous membranes moist without mucosal lesions Heart: Regular rate and rhythm without murmurs Lungs: Clear auscultation bilaterally, no rales or wheezing, normal effort without accessory muscle use. Abdomen: Bowel sounds are present. Abdomen is soft, nontender, nondistended, no masses or organomegaly. Negative Murphy's, Rovsing's, McBurney's, and Carnett's sign. Psych: Age appropriate judgment and insight. Normal mood and affect.  Change in stool - Plan: Comprehensive metabolic panel, CBC, TSH, Ambulatory referral to Gastroenterology  Abdominal cramping - Plan: dicyclomine (BENTYL) 10 MG capsule  Ck labs, refer GI, Bentyl prn. No red flag s/s's.  F/u prn Pt voiced understanding and agreement to the plan.  [place], DO 05/16/21 3:05 PM

## 2021-05-16 NOTE — Patient Instructions (Addendum)
Take Metamucil or Benefiber daily.  If you do not hear anything about your referral in the next 1-2 weeks, call our office and ask for an update.  Stay hydrated.  Give [name] 2-3 business days to get the results of your labs back.   Let us know if you need anything.

## 2021-05-17 LAB — COMPREHENSIVE METABOLIC PANEL
ALT: 20 U/L (ref 0–53)
AST: 26 U/L (ref 0–37)
Albumin: 4.6 g/dL (ref 3.5–5.2)
Alkaline Phosphatase: 66 U/L (ref 39–117)
BUN: 17 mg/dL (ref 6–23)
CO2: 31 mEq/L (ref 19–32)
Calcium: 9.4 mg/dL (ref 8.4–10.5)
Chloride: 103 mEq/L (ref 96–112)
Creatinine, Ser: 1.15 mg/dL (ref 0.40–1.50)
GFR: 62.11 mL/min (ref >60.00)
Glucose, Bld: 104 mg/dL — ABNORMAL HIGH (ref 70–99)
Potassium: 3.9 mEq/L (ref 3.5–5.1)
Sodium: 139 mEq/L (ref 135–145)
Total Bilirubin: 0.9 mg/dL (ref 0.2–1.2)
Total Protein: 6.8 g/dL (ref 6.0–8.3)

## 2021-05-17 LAB — CBC
HCT: 43.6 % (ref 39.0–52.0)
Hemoglobin: 14.7 g/dL (ref 13.0–17.0)
MCHC: 33.6 g/dL (ref 30.0–36.0)
MCV: 92 fl (ref 78.0–100.0)
Platelets: 155 10*3/uL (ref 150.0–400.0)
RBC: 4.74 Mil/uL (ref 4.22–5.81)
RDW: 13.4 % (ref 11.5–15.5)
WBC: 5.1 10*3/uL (ref 4.0–10.5)

## 2021-05-17 LAB — TSH: TSH: 0.86 u[IU]/mL (ref 0.35–5.50)

## 2021-05-18 DIAGNOSIS — D2261 Melanocytic nevi of right upper limb, including shoulder: Secondary | ICD-10-CM | POA: Diagnosis not present

## 2021-05-18 DIAGNOSIS — L821 Other seborrheic keratosis: Secondary | ICD-10-CM | POA: Diagnosis not present

## 2021-05-18 DIAGNOSIS — Z85828 Personal history of other malignant neoplasm of skin: Secondary | ICD-10-CM | POA: Diagnosis not present

## 2021-05-18 DIAGNOSIS — D2262 Melanocytic nevi of left upper limb, including shoulder: Secondary | ICD-10-CM | POA: Diagnosis not present

## 2021-05-18 DIAGNOSIS — L570 Actinic keratosis: Secondary | ICD-10-CM | POA: Diagnosis not present

## 2021-05-18 DIAGNOSIS — D225 Melanocytic nevi of trunk: Secondary | ICD-10-CM | POA: Diagnosis not present

## 2021-06-20 ENCOUNTER — Other Ambulatory Visit: Payer: Self-pay | Admitting: Family Medicine

## 2021-07-27 ENCOUNTER — Encounter: Payer: Self-pay | Admitting: Allergy & Immunology

## 2021-07-27 ENCOUNTER — Other Ambulatory Visit: Payer: Self-pay

## 2021-07-27 ENCOUNTER — Telehealth: Payer: Self-pay

## 2021-07-27 ENCOUNTER — Ambulatory Visit: Payer: Medicare HMO | Admitting: Allergy & Immunology

## 2021-07-27 VITALS — BP 126/70 | HR 80 | Temp 98.2°F | Resp 17 | Ht 73.0 in

## 2021-07-27 DIAGNOSIS — J3089 Other allergic rhinitis: Secondary | ICD-10-CM

## 2021-07-27 DIAGNOSIS — J302 Other seasonal allergic rhinitis: Secondary | ICD-10-CM

## 2021-07-27 DIAGNOSIS — Z87898 Personal history of other specified conditions: Secondary | ICD-10-CM

## 2021-07-27 DIAGNOSIS — R062 Wheezing: Secondary | ICD-10-CM | POA: Diagnosis not present

## 2021-07-27 MED ORDER — IPRATROPIUM BROMIDE 0.06 % NA SOLN: NASAL | 5 refills | Status: DC

## 2021-07-27 NOTE — Progress Notes (Signed)
? ?FOLLOW UP ? ?Date of Service/Encounter:  07/27/21 ? ? ?Assessment:  ? ?Seasonal and perennial allergic rhinitis ?  ?History of Ramsey Hunt syndrome  ?  ?History of wheezing/coughing with episodes of bronchitis - with normal spirometry today ? ?Plan/Recommendations:  ? ?1. Chronic rhinitis ?- Previous testing showed: grasses and indoor molds. ?- Start taking: Atrovent (ipratropium) 0.06% one spray per nostril 3-4 times daily as needed (CAN BE OVER DRYING) ?- Try using this nose spray before meals to see if this helps. ?- We are also referring you to see ENT to see if they know anything that could be done.  ? ?2. History of wheezing/COPD ?- Lung testing looks great today. ?- There is no need for albuterol at this point in time.  ? ?3. Return if symptoms worsen or fail to improve.  ? ? ?Subjective:  ? ?Justin Escobar is a 76 y.o. male presenting today for follow up of  ?Chief Complaint  ?Patient presents with  ? Allergic Rhinitis   ?  About the same since the last visit. Took Flonase for 2 months and saw no difference. Sleeps well during the night, eats and nose starts getting runny/feels congestion. Happens every time he eats. Shingles in ear, face gets droopy. Right eye tears up when he eats.   ? ? ?Justin Escobar has a history of the following: ?Patient Active Problem List  ? Diagnosis Date Noted  ? Seasonal and perennial allergic rhinitis 04/28/2021  ? Hallux rigidus, left foot   ? Low serum vitamin B12 01/13/2019  ? History of skin cancer 07/09/2018  ? Hyperlipidemia 07/09/2018  ? ? ?History obtained from: chart review and patient. ? ?Justin Escobar is a 76 y.o. male presenting for a follow up visit.  We last saw him in February 2023.  At that time, he was positive to grasses and molds.  We started him on Flonase 1 spray per nostril daily as well as Patanase 2 sprays per nostril up to twice daily. ? ?Since last visit, he has continued to have the same issues.  He uses the Patanase and the fluticasone consistently.  He  did this for 2 months religiously without improvement.  Then he did a little experiment where he would just use 1 and not the other.  This was not successful either.  He then tried premedicating with Flonase and/or Patanase before meals to see if this would help but it did not.  He has never been on ipratropium. ? ?He has not seen ENT in a number of years.  He is open to seeing them as well. ? ?He does have a remote history of wheezing.  I did not get a spirometry at the last visit but we did today and it was normal.  He has not used albuterol in several years. ? ?Otherwise, there have been no changes to his past medical history, surgical history, family history, or social history. ? ? ? ?Review of Systems  ?Constitutional: Negative.  Negative for chills, fever, malaise/fatigue and weight loss.  ?HENT:  Negative for congestion, ear discharge, ear pain and sinus pain.   ?     Positive for rhinorrhea.  ?Eyes:  Negative for pain, discharge and redness.  ?Respiratory:  Negative for cough, sputum production, shortness of breath and wheezing.   ?Cardiovascular: Negative.  Negative for chest pain and palpitations.  ?Gastrointestinal:  Negative for abdominal pain, constipation, diarrhea, heartburn, nausea and vomiting.  ?Skin: Negative.  Negative for itching and rash.  ?Neurological:  Negative  for dizziness and headaches.  ?Endo/Heme/Allergies:  Negative for environmental allergies. Does not bruise/bleed easily.   ? ? ? ?Objective:  ? ?Blood pressure 126/70, pulse 80, temperature 98.2 ?F (36.8 ?C), resp. rate 17, height '6\' 1"'$  (1.854 m), SpO2 96 %. ?Body mass index is 24.01 kg/m?. ? ? ? ?Physical Exam ?Constitutional:   ?   Appearance: He is well-developed.  ?HENT:  ?   Head: Normocephalic and atraumatic.  ?   Right Ear: Tympanic membrane, ear canal and external ear normal.  ?   Left Ear: Tympanic membrane, ear canal and external ear normal.  ?   Nose: No nasal deformity, septal deviation, mucosal edema or rhinorrhea.  ?    Right Turbinates: Not enlarged or swollen.  ?   Left Turbinates: Not enlarged or swollen.  ?   Right Sinus: No maxillary sinus tenderness or frontal sinus tenderness.  ?   Left Sinus: No maxillary sinus tenderness or frontal sinus tenderness.  ?   Comments: Nose appears normal today.  ?   Mouth/Throat:  ?   Mouth: Mucous membranes are not pale and not dry.  ?   Pharynx: Uvula midline.  ?Eyes:  ?   General: Lids are normal. No allergic shiner.    ?   Right eye: No discharge.     ?   Left eye: No discharge.  ?   Conjunctiva/sclera: Conjunctivae normal.  ?   Right eye: Right conjunctiva is not injected. No chemosis. ?   Left eye: Left conjunctiva is not injected. No chemosis. ?   Pupils: Pupils are equal, round, and reactive to light.  ?Cardiovascular:  ?   Rate and Rhythm: Normal rate and regular rhythm.  ?   Heart sounds: Normal heart sounds.  ?Pulmonary:  ?   Effort: Pulmonary effort is normal. No tachypnea, accessory muscle usage or respiratory distress.  ?   Breath sounds: Normal breath sounds. No wheezing, rhonchi or rales.  ?Chest:  ?   Chest wall: No tenderness.  ?Lymphadenopathy:  ?   Cervical: No cervical adenopathy.  ?Skin: ?   Coloration: Skin is not pale.  ?   Findings: No abrasion, erythema, petechiae or rash. Rash is not papular, urticarial or vesicular.  ?Neurological:  ?   Mental Status: He is alert.  ?Psychiatric:     ?   Behavior: Behavior is cooperative.  ?  ? ?Diagnostic studies:   ? ?Spirometry: results normal (FEV1: 2.45/74%, FVC: 3.64/82%, FEV1/FVC: 67%).  ?  ?Spirometry consistent with normal pattern. ? ? ? ?Allergy Studies: none ? ? ? ? ? ?  ?Salvatore Marvel, MD  ?[facility] ? ? ? ? ? ? ?

## 2021-07-27 NOTE — Patient Instructions (Addendum)
1. Chronic rhinitis ?- Previous testing showed: grasses and indoor molds. ?- Start taking: Atrovent (ipratropium) 0.06% one spray per nostril 3-4 times daily as needed (CAN BE OVER DRYING) ?- Try using this nose spray before meals to see if this helps. ?- We are also referring you to see ENT to see if they know anything that could be done.  ? ?2. History of wheezing/COPD ?- Lung testing looks great today. ?- There is no need for albuterol at this point in time.  ? ?3. Return if symptoms worsen or fail to improve.  ? ? ?Please inform us of any Emergency Department visits, hospitalizations, or changes in symptoms. Call us before going to the ED for breathing or allergy symptoms since we might be able to fit you in for a sick visit. Feel free to contact us anytime with any questions, problems, or concerns. ? ?It was a pleasure to see you today! ? ?Websites that have reliable patient information: ?1. American Academy of Asthma, Allergy, and Immunology: www.aaaai.org ?2. Food Allergy Research and Education (FARE): foodallergy.org ?3. Mothers of Asthmatics: http://www.asthmacommunitynetwork.org ?4. [employer] of Allergy, Asthma, and Immunology: MonthlyElectricBill.co.uk ? ? ?COVID-19 Vaccine Information can be found at: ShippingScam.co.uk For questions related to vaccine distribution or appointments, please email [email or web address] or call [phone].  ? ?We realize that you might be concerned about having an allergic reaction to the COVID19 vaccines. To help with that concern, WE ARE OFFERING THE COVID19 VACCINES IN OUR OFFICE! Ask the front desk for dates!  ? ? ? ??Like? [name] on Facebook and Instagram for our latest updates!  ?  ? ? ?A healthy democracy works best when [employer] participate! Make sure you are registered to vote! If you have moved or changed any of your contact information, you will need to get this updated before voting! ? ?In some cases, you MAY  be able to register to vote online: [email or web address].[email or web address] ? ? ? ? ? ?You can buy saline nose drops at a pharmacy, or you can make your own saline solution:  ?1. Add 1 cup (240 mL) distilled water to a clean container. If you use tap water, boil it first to sterilize it, and then let it cool until it is lukewarm.  ?2. Add 0.5 tsp (2.5 g) salt to the water. ?3. Add 0.5 tsp (2.5 g) baking soda. ? ? ? ? ?Reducing Pollen Exposure ? ?The American Academy of Allergy, Asthma and Immunology suggests the following steps to reduce your exposure to pollen during allergy seasons. ?   ?Do not hang sheets or clothing out to dry; pollen may collect on these items. ?Do not mow lawns or spend time around freshly cut grass; mowing stirs up pollen. ?Keep windows closed at night.  Keep car windows closed while driving. ?Minimize morning activities outdoors, a time when pollen counts are usually at their highest. ?Stay indoors as much as possible when pollen counts or humidity is high and on windy days when pollen tends to remain in the air longer. ?Use air conditioning when possible.  Many air conditioners have filters that trap the pollen spores. ?Use a HEPA room air filter to remove pollen form the indoor air you breathe. ? ?Control of Mold Allergen  ? ?Mold and fungi can grow on a variety of surfaces provided certain temperature and moisture conditions exist.  Outdoor molds grow on plants, decaying vegetation and soil.  The major outdoor mold, Alternaria and Cladosporium, are found in very high numbers during hot and dry  conditions.  Generally, a late Summer - Fall peak is seen for common outdoor fungal spores.  Rain will temporarily lower outdoor mold spore count, but counts rise rapidly when the rainy period ends.  The most important indoor molds are Aspergillus and Penicillium.  Dark, humid and poorly ventilated basements are ideal sites for mold growth.  The next most common sites of mold growth are the  bathroom and the kitchen. ? ? ?Indoor (Perennial) Mold Control  ? ?Positive indoor molds via skin testing: Aspergillus, Penicillium, Fusarium, Aureobasidium (Pullulara), and Rhizopus ? ?Maintain humidity below 50%. ?Clean washable surfaces with 5% bleach solution. ?Remove sources e.g. contaminated carpets. ? ? ? ? ? ? ? ? ? ? ?

## 2021-07-27 NOTE — Telephone Encounter (Signed)
Per provider, ENT referral for Chronic Rhinitis ?

## 2021-07-28 ENCOUNTER — Encounter: Payer: Self-pay | Admitting: Allergy & Immunology

## 2021-08-04 DIAGNOSIS — H52201 Unspecified astigmatism, right eye: Secondary | ICD-10-CM | POA: Diagnosis not present

## 2021-08-04 DIAGNOSIS — H02834 Dermatochalasis of left upper eyelid: Secondary | ICD-10-CM | POA: Diagnosis not present

## 2021-08-04 DIAGNOSIS — H02831 Dermatochalasis of right upper eyelid: Secondary | ICD-10-CM | POA: Diagnosis not present

## 2021-08-11 DIAGNOSIS — Z87891 Personal history of nicotine dependence: Secondary | ICD-10-CM | POA: Diagnosis not present

## 2021-08-11 DIAGNOSIS — J310 Chronic rhinitis: Secondary | ICD-10-CM | POA: Diagnosis not present

## 2021-08-11 DIAGNOSIS — E785 Hyperlipidemia, unspecified: Secondary | ICD-10-CM | POA: Diagnosis not present

## 2021-08-11 DIAGNOSIS — R030 Elevated blood-pressure reading, without diagnosis of hypertension: Secondary | ICD-10-CM | POA: Diagnosis not present

## 2021-08-11 DIAGNOSIS — Z85828 Personal history of other malignant neoplasm of skin: Secondary | ICD-10-CM | POA: Diagnosis not present

## 2021-09-06 NOTE — Telephone Encounter (Signed)
Patient is requesting ENT within [place].

## 2021-09-08 NOTE — Telephone Encounter (Addendum)
Unfortunately Justin Escobar does not have an ENT at all.   Referral has been placed to [facility]. [address], [place] [address] [phone])  Called and left detailed message for patient about referral. Provided pt with address & phone number to ENT office in voicemail and advised him to reach out to their office if he has not heard from them in 3-5 business days.

## 2021-09-13 ENCOUNTER — Other Ambulatory Visit: Payer: Self-pay | Admitting: Family Medicine

## 2021-10-11 DIAGNOSIS — J310 Chronic rhinitis: Secondary | ICD-10-CM | POA: Diagnosis not present

## 2021-10-30 DIAGNOSIS — J342 Deviated nasal septum: Secondary | ICD-10-CM | POA: Diagnosis not present

## 2021-10-30 DIAGNOSIS — J310 Chronic rhinitis: Secondary | ICD-10-CM | POA: Diagnosis not present

## 2021-10-30 DIAGNOSIS — J343 Hypertrophy of nasal turbinates: Secondary | ICD-10-CM | POA: Diagnosis not present

## 2021-11-15 ENCOUNTER — Other Ambulatory Visit: Payer: Self-pay | Admitting: Family Medicine

## 2021-12-25 ENCOUNTER — Telehealth: Payer: Self-pay | Admitting: Family Medicine

## 2021-12-25 NOTE — Telephone Encounter (Signed)
Patient would like to [employer] from [place] DO to Jerline Pain, MD.

## 2021-12-25 NOTE — Telephone Encounter (Signed)
OK w me.  

## 2021-12-26 NOTE — Telephone Encounter (Signed)
Artas with me.   Algis Greenhouse. Jerline Pain, MD 12/26/2021 9:37 AM

## 2022-01-01 ENCOUNTER — Encounter: Payer: Self-pay | Admitting: Family Medicine

## 2022-01-01 ENCOUNTER — Ambulatory Visit (INDEPENDENT_AMBULATORY_CARE_PROVIDER_SITE_OTHER): Payer: Medicare HMO | Admitting: Family Medicine

## 2022-01-01 VITALS — BP 125/75 | HR 70 | Temp 98.2°F | Ht 73.0 in | Wt 182.0 lb

## 2022-01-01 DIAGNOSIS — M2022 Hallux rigidus, left foot: Secondary | ICD-10-CM | POA: Diagnosis not present

## 2022-01-01 DIAGNOSIS — E785 Hyperlipidemia, unspecified: Secondary | ICD-10-CM

## 2022-01-01 DIAGNOSIS — J310 Chronic rhinitis: Secondary | ICD-10-CM

## 2022-01-01 NOTE — Assessment & Plan Note (Signed)
Continue management per ENT.  May be undergoing cryoablation soon.

## 2022-01-01 NOTE — Assessment & Plan Note (Signed)
Still has quite a bit of pain in his left foot.  Will refer to orthopedics for second opinion.  May want to have hardware removed.

## 2022-01-01 NOTE — Progress Notes (Signed)
Justin Escobar is a 76 y.o. male who presents today for an office visit.  He is transferring care to this office.   Assessment/Plan:  Chronic Problems Addressed Today: Chronic rhinitis Continue management per ENT.  May be undergoing cryoablation soon.  Hallux rigidus, left foot Still has quite a bit of pain in his left foot.  Will refer to orthopedics for second opinion.  May want to have hardware removed.  Hyperlipidemia Doing well on simvastatin 40 mg daily.  We will check labs when he comes back for CPE in 3 to 6 months.  Up-to-date on flu shot.    Subjective:  HPI:  See A/p for status of chronic conditions.   One of his concerns today is chronic rhinitis. This has been going on for several years. Started after having Ramsay Hunt Syndrome. He was referred to an allergist and had allergy testing.  This did not show anything significant.  Follow-up with ENT.  May be undergoing ablation soon.  Still has persistent pain in his left foot status first MTP arthrodesis a couple of years ago.  Pain seems to be worsening.  He followed up with podiatry and was told that there was not much else that they could do.   PMH:  The following were reviewed and entered/updated in epic: Past Medical History:  Diagnosis Date   History of basal cell carcinoma (BCC) of skin    Hyperlipidemia    Rhinitis    Patient Active Problem List   Diagnosis Date Noted   Chronic rhinitis 04/28/2021   Hallux rigidus, left foot    Low serum vitamin B12 01/13/2019   History of skin cancer 07/09/2018   Hyperlipidemia 07/09/2018   Past Surgical History:  Procedure Laterality Date   ARTHRODESIS METATARSALPHALANGEAL JOINT (MTPJ) Left 05/19/2019   Procedure: LEFT 1ST METATARSALPHALANGEAL JOINT FUSION;  Surgeon: Newt Minion, MD;  Location: [facility];  Service: Orthopedics;  Laterality: Left;   MOHS SURGERY      Family History  Problem Relation Age of Onset   Alzheimer's disease Mother     Cancer Neg Hx     Medications- reviewed and updated Current Outpatient Medications  Medication Sig Dispense Refill   simvastatin (ZOCOR) 40 MG tablet Take 1 tablet by mouth once daily 90 tablet 0   No current facility-administered medications for this visit.    Allergies-reviewed and updated Allergies  Allergen Reactions   5-Alpha Reductase Inhibitors    Latex Rash    Social History   Socioeconomic History   Marital status: Married    Spouse name: Not on file   Number of children: Not on file   Years of education: Not on file   Highest education level: Not on file  Occupational History   Not on file  Tobacco Use   Smoking status: Former   Smokeless tobacco: Never  Substance and Sexual Activity   Alcohol use: Yes    Comment: 6-7 drinks per week   Drug use: Never   Sexual activity: Not on file  Other Topics Concern   Not on file  Social History Narrative   Not on file   Social Determinants of Health   Financial Resource Strain: Low Risk  (03/02/2021)   Overall Financial Resource Strain (CARDIA)    Difficulty of Paying Living Expenses: Not hard at all  Food Insecurity: No Food Insecurity (03/02/2021)   Hunger Vital Sign    Worried About Running Out of Food in the Last Year: Never true  Ran Out of Food in the Last Year: Never true  Transportation Needs: No Transportation Needs (03/02/2021)   PRAPARE - Hydrologist (Medical): No    Lack of Transportation (Non-Medical): No  Physical Activity: Inactive (03/02/2021)   Exercise Vital Sign    Days of Exercise per Week: 0 days    Minutes of Exercise per Session: 0 min  Stress: No Stress Concern Present (03/02/2021)   [place]    Feeling of Stress : Not at all  Social Connections: [place] (03/02/2021)   Social Connection and Isolation Panel [NHANES]    Frequency of Communication with Friends and Family: More  than three times a week    Frequency of Social Gatherings with Friends and Family: More than three times a week    Attends Religious Services: More than 4 times per year    Active Member of [employer] or Organizations: Yes    Attends Archivist Meetings: 1 to 4 times per year    Marital Status: Married            Objective:  Physical Exam: BP 125/75   Pulse 70   Temp 98.2 F (36.8 C) (Temporal)   Ht '6\' 1"'$  (1.854 m)   Wt 182 lb (82.6 kg)   SpO2 96%   BMI 24.01 kg/m   Gen: No acute distress, resting comfortably Neuro: Grossly normal, moves all extremities Psych: Normal affect and thought content   Time Spent: 40 minutes of total time was spent on the date of the encounter performing the following actions: chart review prior to seeing the patient including recent visits with previous PCP, obtaining history, performing a medically necessary exam, counseling on the treatment plan, placing orders, and documenting in our EHR.        Algis Greenhouse. Jerline Pain, MD 01/01/2022 2:48 PM

## 2022-01-01 NOTE — Assessment & Plan Note (Signed)
Doing well on simvastatin 40 mg daily.  We will check labs when he comes back for CPE in 3 to 6 months.

## 2022-01-01 NOTE — Patient Instructions (Addendum)
It was very nice to see you today!  I will refer you to an orthopedist for second opinion.  Please continue to work on diet and exercise.  I will see you back in 3 to 6 months for your annual physical.  Come back sooner if needed.  Take care, Dr Jerline Pain  PLEASE NOTE:  If you had any lab tests please let us know if you have not heard back within a few days. You may see your results on mychart before we have a chance to review them but we will give you a call once they are reviewed by [name]. If we ordered any referrals today, please let us know if you have not heard from their office within the next week.   Please try these tips to maintain a healthy lifestyle:  Eat at least 3 REAL meals and 1-2 snacks per day.  Aim for no more than 5 hours between eating.  If you eat breakfast, please do so within one hour of getting up.   Each meal should contain half fruits/vegetables, one quarter protein, and one quarter carbs (no bigger than a computer mouse)  Cut down on sweet beverages. This includes juice, soda, and sweet tea.   Drink at least 1 glass of water with each meal and aim for at least 8 glasses per day  Exercise at least 150 minutes every week.

## 2022-01-04 DIAGNOSIS — R079 Chest pain, unspecified: Secondary | ICD-10-CM | POA: Diagnosis not present

## 2022-01-15 DIAGNOSIS — D225 Melanocytic nevi of trunk: Secondary | ICD-10-CM | POA: Diagnosis not present

## 2022-01-15 DIAGNOSIS — L578 Other skin changes due to chronic exposure to nonionizing radiation: Secondary | ICD-10-CM | POA: Diagnosis not present

## 2022-01-15 DIAGNOSIS — C44319 Basal cell carcinoma of skin of other parts of face: Secondary | ICD-10-CM | POA: Diagnosis not present

## 2022-01-15 DIAGNOSIS — L570 Actinic keratosis: Secondary | ICD-10-CM | POA: Diagnosis not present

## 2022-01-15 DIAGNOSIS — D2271 Melanocytic nevi of right lower limb, including hip: Secondary | ICD-10-CM | POA: Diagnosis not present

## 2022-01-15 DIAGNOSIS — D2261 Melanocytic nevi of right upper limb, including shoulder: Secondary | ICD-10-CM | POA: Diagnosis not present

## 2022-01-15 DIAGNOSIS — L821 Other seborrheic keratosis: Secondary | ICD-10-CM | POA: Diagnosis not present

## 2022-01-15 DIAGNOSIS — Z85828 Personal history of other malignant neoplasm of skin: Secondary | ICD-10-CM | POA: Diagnosis not present

## 2022-01-15 DIAGNOSIS — D1801 Hemangioma of skin and subcutaneous tissue: Secondary | ICD-10-CM | POA: Diagnosis not present

## 2022-02-05 DIAGNOSIS — M25872 Other specified joint disorders, left ankle and foot: Secondary | ICD-10-CM | POA: Diagnosis not present

## 2022-02-05 DIAGNOSIS — M7742 Metatarsalgia, left foot: Secondary | ICD-10-CM | POA: Diagnosis not present

## 2022-02-05 DIAGNOSIS — M79672 Pain in left foot: Secondary | ICD-10-CM | POA: Diagnosis not present

## 2022-03-05 ENCOUNTER — Ambulatory Visit: Payer: Medicare HMO

## 2022-03-12 DIAGNOSIS — J069 Acute upper respiratory infection, unspecified: Secondary | ICD-10-CM | POA: Diagnosis not present

## 2022-03-16 DIAGNOSIS — J22 Unspecified acute lower respiratory infection: Secondary | ICD-10-CM | POA: Diagnosis not present

## 2022-03-18 DIAGNOSIS — Z03818 Encounter for observation for suspected exposure to other biological agents ruled out: Secondary | ICD-10-CM | POA: Diagnosis not present

## 2022-03-18 DIAGNOSIS — B974 Respiratory syncytial virus as the cause of diseases classified elsewhere: Secondary | ICD-10-CM | POA: Diagnosis not present

## 2022-03-18 DIAGNOSIS — J22 Unspecified acute lower respiratory infection: Secondary | ICD-10-CM | POA: Diagnosis not present

## 2022-03-18 DIAGNOSIS — R051 Acute cough: Secondary | ICD-10-CM | POA: Diagnosis not present

## 2022-03-18 DIAGNOSIS — R0602 Shortness of breath: Secondary | ICD-10-CM | POA: Diagnosis not present

## 2022-03-20 ENCOUNTER — Ambulatory Visit (INDEPENDENT_AMBULATORY_CARE_PROVIDER_SITE_OTHER): Payer: Medicare HMO

## 2022-03-20 VITALS — Wt 182.0 lb

## 2022-03-20 DIAGNOSIS — Z0000 Encounter for general adult medical examination without abnormal findings: Secondary | ICD-10-CM

## 2022-03-20 NOTE — Patient Instructions (Signed)
Justin Escobar , Thank you for taking time to come for your Medicare Wellness Visit. I appreciate your ongoing commitment to your health goals. Please review the following plan we discussed and let me know if I can assist you in the future.   These are the goals we discussed:  Goals      Patient Stated     Continue Harmonica lessons        This is a list of the screening recommended for you and due dates:  Health Maintenance  Topic Date Due   DTaP/Tdap/Td vaccine (1 - Tdap) Never done   COVID-19 Vaccine (7 - 2023-24 season) 02/21/2022   Medicare Annual Wellness Visit  03/02/2022   Pneumonia Vaccine  Completed   Flu Shot  Completed   Hepatitis C Screening: USPSTF Recommendation to screen - Ages 18-79 yo.  Completed   Zoster (Shingles) Vaccine  Completed   HPV Vaccine  Aged Out   Colon Cancer Screening  Discontinued    Advanced directives: Please bring a copy of your health care power of attorney and living will to the office at your convenience.  Conditions/risks identified: none at this time   Next appointment: Follow up in one year for your annual wellness visit.   Preventive Care 66 Years and Older, Male  Preventive care refers to lifestyle choices and visits with your health care provider that can promote health and wellness. What does preventive care include? A yearly physical exam. This is also called an annual well check. Dental exams once or twice a year. Routine eye exams. Ask your health care provider how often you should have your eyes checked. Personal lifestyle choices, including: Daily care of your teeth and gums. Regular physical activity. Eating a healthy diet. Avoiding tobacco and drug use. Limiting alcohol use. Practicing safe sex. Taking low doses of aspirin every day. Taking vitamin and mineral supplements as recommended by your health care provider. What happens during an annual well check? The services and screenings done by your health care provider  during your annual well check will depend on your age, overall health, lifestyle risk factors, and family history of disease. Counseling  Your health care provider may ask you questions about your: Alcohol use. Tobacco use. Drug use. Emotional well-being. Home and relationship well-being. Sexual activity. Eating habits. History of falls. Memory and ability to understand (cognition). Work and work Statistician. Screening  You may have the following tests or measurements: Height, weight, and BMI. Blood pressure. Lipid and cholesterol levels. These may be checked every 5 years, or more frequently if you are over 42 years old. Skin check. Lung cancer screening. You may have this screening every year starting at age 43 if you have a 30-pack-year history of smoking and currently smoke or have quit within the past 15 years. Fecal occult blood test (FOBT) of the stool. You may have this test every year starting at age 1. Flexible sigmoidoscopy or colonoscopy. You may have a sigmoidoscopy every 5 years or a colonoscopy every 10 years starting at age 61. Prostate cancer screening. Recommendations will vary depending on your family history and other risks. Hepatitis C blood test. Hepatitis B blood test. Sexually transmitted disease (STD) testing. Diabetes screening. This is done by checking your blood sugar (glucose) after you have not eaten for a while (fasting). You may have this done every 1-3 years. Abdominal aortic aneurysm (AAA) screening. You may need this if you are a current or former smoker. Osteoporosis. You may be screened  starting at age 11 if you are at high risk. Talk with your health care provider about your test results, treatment options, and if necessary, the need for more tests. Vaccines  Your health care provider may recommend certain vaccines, such as: Influenza vaccine. This is recommended every year. Tetanus, diphtheria, and acellular pertussis (Tdap, Td) vaccine. You  may need a Td booster every 10 years. Zoster vaccine. You may need this after age 42. Pneumococcal 13-valent conjugate (PCV13) vaccine. One dose is recommended after age 44. Pneumococcal polysaccharide (PPSV23) vaccine. One dose is recommended after age 63. Talk to your health care provider about which screenings and vaccines you need and how often you need them. This information is not intended to replace advice given to you by your health care provider. Make sure you discuss any questions you have with your health care provider. Document Released: 04/08/2015 Document Revised: 11/30/2015 Document Reviewed: 01/11/2015 Elsevier Interactive Patient Education  2017 [place] Prevention in the Home Falls can cause injuries. They can happen to people of all ages. There are many things you can do to make your home safe and to help prevent falls. What can I do on the outside of my home? Regularly fix the edges of walkways and driveways and fix any cracks. Remove anything that might make you trip as you walk through a door, such as a raised step or threshold. Trim any bushes or trees on the path to your home. Use bright outdoor lighting. Clear any walking paths of anything that might make someone trip, such as rocks or tools. Regularly check to see if handrails are loose or broken. Make sure that both sides of any steps have handrails. Any raised decks and porches should have guardrails on the edges. Have any leaves, snow, or ice cleared regularly. Use sand or salt on walking paths during winter. Clean up any spills in your garage right away. This includes oil or grease spills. What can I do in the bathroom? Use night lights. Install grab bars by the toilet and in the tub and shower. Do not use towel bars as grab bars. Use non-skid mats or decals in the tub or shower. If you need to sit down in the shower, use a plastic, non-slip stool. Keep the floor dry. Clean up any water that spills  on the floor as soon as it happens. Remove soap buildup in the tub or shower regularly. Attach bath mats securely with double-sided non-slip rug tape. Do not have throw rugs and other things on the floor that can make you trip. What can I do in the bedroom? Use night lights. Make sure that you have a light by your bed that is easy to reach. Do not use any sheets or blankets that are too big for your bed. They should not hang down onto the floor. Have a firm chair that has side arms. You can use this for support while you get dressed. Do not have throw rugs and other things on the floor that can make you trip. What can I do in the kitchen? Clean up any spills right away. Avoid walking on wet floors. Keep items that you use a lot in easy-to-reach places. If you need to reach something above you, use a strong step stool that has a grab bar. Keep electrical cords out of the way. Do not use floor polish or wax that makes floors slippery. If you must use wax, use non-skid floor wax. Do not have throw  rugs and other things on the floor that can make you trip. What can I do with my stairs? Do not leave any items on the stairs. Make sure that there are handrails on both sides of the stairs and use them. Fix handrails that are broken or loose. Make sure that handrails are as long as the stairways. Check any carpeting to make sure that it is firmly attached to the stairs. Fix any carpet that is loose or worn. Avoid having throw rugs at the top or bottom of the stairs. If you do have throw rugs, attach them to the floor with carpet tape. Make sure that you have a light switch at the top of the stairs and the bottom of the stairs. If you do not have them, ask someone to add them for you. What else can I do to help prevent falls? Wear shoes that: Do not have high heels. Have rubber bottoms. Are comfortable and fit you well. Are closed at the toe. Do not wear sandals. If you use a stepladder: Make  sure that it is fully opened. Do not climb a closed stepladder. Make sure that both sides of the stepladder are locked into place. Ask someone to hold it for you, if possible. Clearly mark and make sure that you can see: Any grab bars or handrails. First and last steps. Where the edge of each step is. Use tools that help you move around (mobility aids) if they are needed. These include: Canes. Walkers. Scooters. Crutches. Turn on the lights when you go into a dark area. Replace any light bulbs as soon as they burn out. Set up your furniture so you have a clear path. Avoid moving your furniture around. If any of your floors are uneven, fix them. If there are any pets around you, be aware of where they are. Review your medicines with your doctor. Some medicines can make you feel dizzy. This can increase your chance of falling. Ask your doctor what other things that you can do to help prevent falls. This information is not intended to replace advice given to you by your health care provider. Make sure you discuss any questions you have with your health care provider. Document Released: 01/06/2009 Document Revised: 08/18/2015 Document Reviewed: 04/16/2014 Elsevier Interactive Patient Education  2017 [employer].

## 2022-03-20 NOTE — Progress Notes (Addendum)
I connected with  Bonnielee Haff on 03/20/22 by a audio enabled telemedicine application and verified that I am speaking with the correct person using two identifiers.  Patient Location: Home  Provider Location: Office/Clinic  I discussed the limitations of evaluation and management by telemedicine. The patient expressed understanding and agreed to proceed.   Subjective:   Verne Lanuza is a 76 y.o. male who presents for Medicare Annual/Subsequent preventive examination.  Review of Systems     Cardiac Risk Factors include: advanced age (>7mn, >>48women);dyslipidemia;male gender     Objective:    Today's Vitals   03/20/22 1452  Weight: 182 lb (82.6 kg)   Body mass index is 24.01 kg/m.     03/20/2022    2:59 PM 03/02/2021    3:51 PM 05/19/2019    7:27 AM 05/12/2019    2:01 PM  Advanced Directives  Does Patient Have a Medical Advance Directive? Yes No Yes Yes  Type of [place] will   [place]  Does patient want to make changes to medical advance directive?  Yes (MAU/Ambulatory/Procedural Areas - Information given)  No - Patient declined  Copy of [place] Chart? No - copy requested       Current Medications (verified) Outpatient Encounter Medications as of 03/20/2022  Medication Sig   albuterol (VENTOLIN HFA) 108 (90 Base) MCG/ACT inhaler SMARTSIG:2 Puff(s) By Mouth Every 6 Hours PRN   amoxicillin-clavulanate (AUGMENTIN) 875-125 MG tablet SMARTSIG:1 Tablet(s) By Mouth Every 12 Hours   COMIRNATY syringe    FLUZONE HIGH-DOSE QUADRIVALENT 0.7 ML SUSY    simvastatin (ZOCOR) 40 MG tablet Take 1 tablet by mouth once daily   No facility-administered encounter medications on file as of 03/20/2022.    Allergies (verified) 5-alpha reductase inhibitors and Latex   History: Past Medical History:  Diagnosis Date   History of basal cell carcinoma (BCC) of skin    Hyperlipidemia     Rhinitis    Past Surgical History:  Procedure Laterality Date   ARTHRODESIS METATARSALPHALANGEAL JOINT (MTPJ) Left 05/19/2019   Procedure: LEFT 1ST METATARSALPHALANGEAL JOINT FUSION;  Surgeon: DNewt Minion MD;  [place]  Service: Orthopedics;  Laterality: Left;   MOHS SURGERY     Family History  Problem Relation Age of Onset   Alzheimer's disease Mother    Cancer Neg Hx    Social History   Socioeconomic History   Marital status: Married    Spouse name: Not on file   Number of children: Not on file   Years of education: Not on file   Highest education level: Not on file  Occupational History   Not on file  Tobacco Use   Smoking status: Former   Smokeless tobacco: Never  Substance and Sexual Activity   Alcohol use: Yes    Comment: 6-7 drinks per week   Drug use: Never   Sexual activity: Not on file  Other Topics Concern   Not on file  Social History Narrative   Not on file   Social Determinants of Health   Financial Resource Strain: Low Risk  (03/20/2022)   Overall Financial Resource Strain (CARDIA)    Difficulty of Paying Living Expenses: Not hard at all  Food Insecurity: No Food Insecurity (03/20/2022)   Hunger Vital Sign    Worried About Running Out of Food in the Last Year: Never true    [place] the Last Year:  Never true  Transportation Needs: No Transportation Needs (03/20/2022)   PRAPARE - Hydrologist (Medical): No    Lack of Transportation (Non-Medical): No  Physical Activity: Inactive (03/20/2022)   Exercise Vital Sign    Days of Exercise per Week: 0 days    Minutes of Exercise per Session: 0 min  Stress: No Stress Concern Present (03/20/2022)   [place]    Feeling of Stress : Not at all  Social Connections: Moderately Integrated (03/20/2022)   Social Connection and Isolation Panel [NHANES]    Frequency of  Communication with Friends and Family: More than three times a week    Frequency of Social Gatherings with Friends and Family: More than three times a week    Attends Religious Services: Never    Marine scientist or Organizations: Yes    Attends Music therapist: 1 to 4 times per year    Marital Status: Married    Tobacco Counseling Counseling given: Not Answered   Clinical Intake:  Pre-visit preparation completed: Yes  Pain : No/denies pain     BMI - recorded: 24.01 Nutritional Status: BMI of 19-24  Normal Nutritional Risks: None Diabetes: No  How often do you need to have someone help you when you read instructions, pamphlets, or other written materials from your doctor or pharmacy?: 1 - Never  Diabetic?no  Interpreter Needed?: No  Information entered by :: Charlott Rakes, LPN   Activities of Daily Living    03/20/2022    3:00 PM  In your present state of health, do you have any difficulty performing the following activities:  Hearing? 0  Vision? 0  Difficulty concentrating or making decisions? 0  Walking or climbing stairs? 0  Dressing or bathing? 0  Doing errands, shopping? 0  Preparing Food and eating ? N  Using the Toilet? N  In the past six months, have you accidently leaked urine? N  Do you have problems with loss of bowel control? N  Managing your Medications? N  Managing your Finances? N  Housekeeping or managing your Housekeeping? N    Patient Care Team: Vivi Barrack, MD as PCP - General (Family Medicine)  Indicate any recent Medical Services you may have received from other than [facility] providers in the past year (date may be approximate).     Assessment:   This is a routine wellness examination for [place].  Hearing/Vision screen Hearing Screening - Comments:: Pt denies any hearing issues  Vision Screening - Comments:: Pt follows up with W Dr Delman Cheadle for annul eye exams   Dietary issues and exercise activities  discussed: Current Exercise Habits: The patient has a physically strenuous job, but has no regular exercise apart from work.   Goals Addressed             This Visit's Progress    Patient Stated       None at this time        Depression Screen    03/20/2022    2:56 PM 01/01/2022    2:06 PM 04/17/2021    2:43 PM 03/02/2021    3:55 PM 09/07/2020    2:46 PM  PHQ 2/9 Scores  PHQ - 2 Score 0 0 0 0 0    Fall Risk    03/20/2022    2:59 PM 01/01/2022    2:06 PM 04/17/2021    2:42 PM 03/02/2021    3:54  PM 09/07/2020    2:46 PM  [place] in the past year? 0 0 0 0 0  Number falls in past yr: 0 0 0 0 0  Injury with Fall? 0 0 0 0 0  Risk for fall due to : Impaired vision;Impaired balance/gait No Fall Risks History of fall(s)  No Fall Risks  Follow up Falls prevention discussed  Falls evaluation completed Falls prevention discussed Falls evaluation completed    FALL RISK PREVENTION PERTAINING TO THE HOME:  Any stairs in or around the home? No  If so, are there any without handrails? No  Home free of loose throw rugs in walkways, pet beds, electrical cords, etc? Yes  Adequate lighting in your home to reduce risk of falls? Yes   ASSISTIVE DEVICES UTILIZED TO PREVENT FALLS:  Life alert? No  Use of a cane, walker or w/c? No  Grab bars in the bathroom? Yes  Shower chair or bench in shower? Yes  Elevated toilet seat or a handicapped toilet? Yes   TIMED UP AND GO:  Was the test performed? No .  Cognitive Function:        03/20/2022    3:00 PM  6CIT Screen  What Year? 0 points  What month? 0 points  What time? 0 points  Count back from 20 0 points  Months in reverse 0 points  Repeat phrase 0 points  Total Score 0 points    Immunizations Immunization History  Administered Date(s) Administered   Fluad Quad(high Dose 65+) 12/16/2021   Influenza, High Dose Seasonal PF 12/22/2020   Influenza-Unspecified 04/27/2015, 12/09/2018   PFIZER Comirnaty(Gray  Top)Covid-19 Tri-Sucrose Vaccine 12/27/2021   PFIZER(Purple Top)SARS-COV-2 Vaccination 04/19/2019, 05/11/2019, 01/10/2020, 08/04/2020   PNEUMOCOCCAL CONJUGATE-20 01/23/2021   Pfizer Covid-19 Vaccine Bivalent Booster 76yr & up 01/23/2021   Zoster Recombinat (Shingrix) 07/30/2019, 11/01/2019    TDAP status: Due, Education has been provided regarding the importance of this vaccine. Advised may receive this vaccine at local pharmacy or Health Dept. Aware to provide a copy of the vaccination record if obtained from local pharmacy or Health Dept. Verbalized acceptance and understanding.  Flu Vaccine status: Up to date  Pneumococcal vaccine status: Up to date  Covid-19 vaccine status: Completed vaccines  Qualifies for Shingles Vaccine? Yes   Zostavax completed Yes   Shingrix Completed?: Yes  Screening Tests Health Maintenance  Topic Date Due   DTaP/Tdap/Td (1 - Tdap) Never done   COVID-19 Vaccine (7 - 2023-24 season) 02/21/2022   Medicare Annual Wellness (AWV)  03/21/2023   Pneumonia Vaccine 76 Years old  Completed   INFLUENZA VACCINE  Completed   Hepatitis C Screening  Completed   Zoster Vaccines- Shingrix  Completed   HPV VACCINES  Aged Out   COLONOSCOPY (Pts 45-45yrInsurance coverage will need to be confirmed)  Discontinued    Health Maintenance  Health Maintenance Due  Topic Date Due   DTaP/Tdap/Td (1 - Tdap) Never done   COVID-19 Vaccine (7 - 2023-24 season) 02/21/2022    Colorectal cancer screening: Type of screening: Colonoscopy. Completed 02/03/15. Repeat every as directed by MD  years  Additional Screening:  Hepatitis C Screening:  Completed 01/13/19  Vision Screening: Recommended annual ophthalmology exams for early detection of glaucoma and other disorders of the eye. Is the patient up to date with their annual eye exam?  Yes  Who is the provider or what is the name of the office in which the patient attends annual eye exams? Dr  Delman Cheadle  If pt is not  established with a provider, would they like to be referred to a provider to establish care? No .   Dental Screening: Recommended annual dental exams for proper oral hygiene  Community Resource Referral / Chronic Care Management: CRR required this visit?  No   CCM required this visit?  No      Plan:     I have personally reviewed and noted the following in the patient's chart:   Medical and social history Use of alcohol, tobacco or illicit drugs  Current medications and supplements including opioid prescriptions. Patient is not currently taking opioid prescriptions. Functional ability and status Nutritional status Physical activity Advanced directives List of other physicians Hospitalizations, surgeries, and ER visits in previous 12 months Vitals Screenings to include cognitive, depression, and falls Referrals and appointments  In addition, I have reviewed and discussed with patient certain preventive protocols, quality metrics, and best practice recommendations. A written personalized care plan for preventive services as well as general preventive health recommendations were provided to patient.     Willette Brace, LPN   [id number]   Nurse Notes: none

## 2022-03-24 DIAGNOSIS — B338 Other specified viral diseases: Secondary | ICD-10-CM | POA: Diagnosis not present

## 2022-03-29 ENCOUNTER — Ambulatory Visit: Payer: Medicare HMO | Admitting: Family Medicine

## 2022-04-05 ENCOUNTER — Ambulatory Visit (INDEPENDENT_AMBULATORY_CARE_PROVIDER_SITE_OTHER): Payer: Medicare HMO | Admitting: Family Medicine

## 2022-04-05 ENCOUNTER — Encounter: Payer: Self-pay | Admitting: Family Medicine

## 2022-04-05 VITALS — BP 121/70 | HR 79 | Temp 97.3°F | Ht 73.0 in | Wt 179.6 lb

## 2022-04-05 DIAGNOSIS — J310 Chronic rhinitis: Secondary | ICD-10-CM | POA: Diagnosis not present

## 2022-04-05 DIAGNOSIS — E785 Hyperlipidemia, unspecified: Secondary | ICD-10-CM | POA: Diagnosis not present

## 2022-04-05 MED ORDER — NYSTATIN 100000 UNIT/ML MT SUSP: 5.0000 mL | Freq: Four times a day (QID) | OROMUCOSAL | 0 refills | Status: DC

## 2022-04-05 NOTE — Assessment & Plan Note (Signed)
Continue management per ENT.  He may be having cryoablation surgery soon.  He will discuss further with ENT though at this point seems like the benefit would outweigh the potential risks given that symptoms are still very bothersome to him.

## 2022-04-05 NOTE — Assessment & Plan Note (Signed)
On simvastatin 40 mg daily.  He will come back soon for CPE and we will check labs at that time.

## 2022-04-05 NOTE — Progress Notes (Signed)
   Justin Escobar is a 77 y.o. male who presents today for an office visit.  Assessment/Plan:  New/Acute Problems: RSV During exam today.  Seems to be recovering well.  He can stop his albuterol and other medications for this  Thrush No red flags.  Likely precipitated by recent Augmentin and recent RSV infection.  Will start nystatin swish and spit.  We discussed reasons return to care.  If not proving will consider trial of Diflucan.  Chronic Problems Addressed Today: Chronic rhinitis Continue management per ENT.  He may be having cryoablation surgery soon.  He will discuss further with ENT though at this point seems like the benefit would outweigh the potential risks given that symptoms are still very bothersome to him.  Hyperlipidemia On simvastatin 40 mg daily.  He will come back soon for CPE and we will check labs at that time.     Subjective:  HPI:  Patient here today for follow-up.  We last saw him about 3 months ago for his initial visit with [name].  He was doing well at this time.  Since our last visit he unfortunately got RSV.  This happened about a month ago. Went to [place] walk in clinic. and received a positive test. He had a chest xray also. He was started on albuterol and amoxicillin. Did not have much improvement with this and was started on prednisone. Initially started with cough and shortness of breath. Symptoms have improved recently over the last several days.   Still has occasional cough that is much better than previous. He has had some issue with irritated tongue and lips.  No treatments tried for this.  See A/p for status of chronic conditions.        Objective:  Physical Exam: BP 121/70   Pulse 79   Temp (!) 97.3 F (36.3 C) (Temporal)   Ht '6\' 1"'$  (1.854 m)   Wt 179 lb 9.6 oz (81.5 kg)   SpO2 95%   BMI 23.70 kg/m   Gen: No acute distress, resting comfortably HEENT: Erythema noted around lips and tongue with white discharge on tongue but is easily  scraped off with tongue depressor CV: Regular rate and rhythm with no murmurs appreciated Pulm: Normal work of breathing, clear to auscultation bilaterally with no crackles, wheezes, or rhonchi Neuro: Grossly normal, moves all extremities Psych: Normal affect and thought content      [name] Pain, MD 04/05/2022 2:49 PM

## 2022-04-05 NOTE — Patient Instructions (Signed)
It was very nice to see you today!  You have thrush.  Please use the nystatin.  Let me know if not improving in the next few days.  Will see you back soon for your annual physical.  Come back sooner if needed.  Take care, Dr Jerline Pain  PLEASE NOTE:  If you had any lab tests, please let us know if you have not heard back within a few days. You may see your results on mychart before we have a chance to review them but we will give you a call once they are reviewed by [name].   If we ordered any referrals today, please let us know if you have not heard from their office within the next week.   If you had any urgent prescriptions sent in today, please check with the pharmacy within an hour of our visit to make sure the prescription was transmitted appropriately.   Please try these tips to maintain a healthy lifestyle:  Eat at least 3 REAL meals and 1-2 snacks per day.  Aim for no more than 5 hours between eating.  If you eat breakfast, please do so within one hour of getting up.   Each meal should contain half fruits/vegetables, one quarter protein, and one quarter carbs (no bigger than a computer mouse)  Cut down on sweet beverages. This includes juice, soda, and sweet tea.   Drink at least 1 glass of water with each meal and aim for at least 8 glasses per day  Exercise at least 150 minutes every week.

## 2022-04-06 IMAGING — DX DG CHEST 2V
2 series · 2 of 2 positions shown · non-contrast
Comparison: None.

CLINICAL DATA: Cough

EXAM:
CHEST - 2 VIEW

[chest pa]
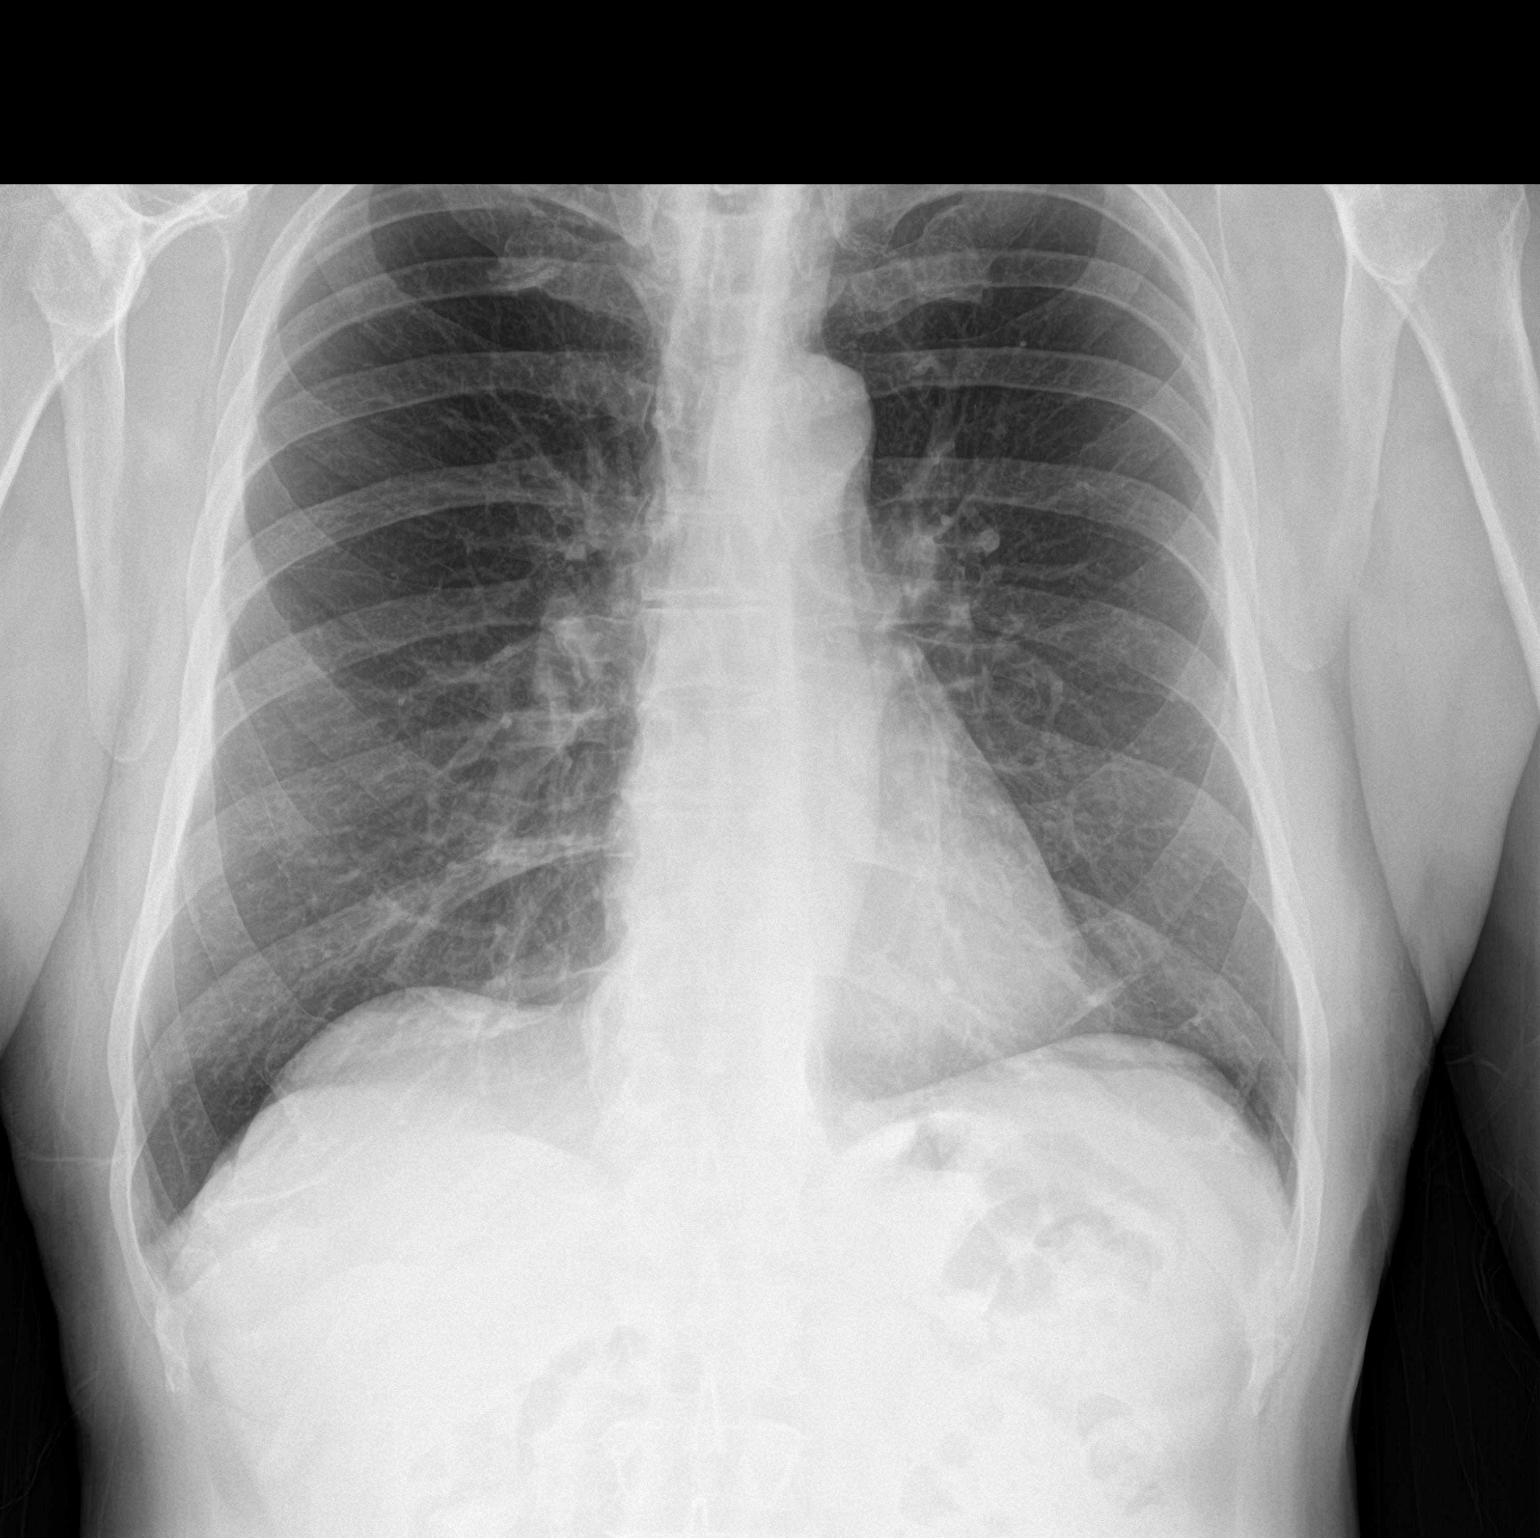

[chest lat]
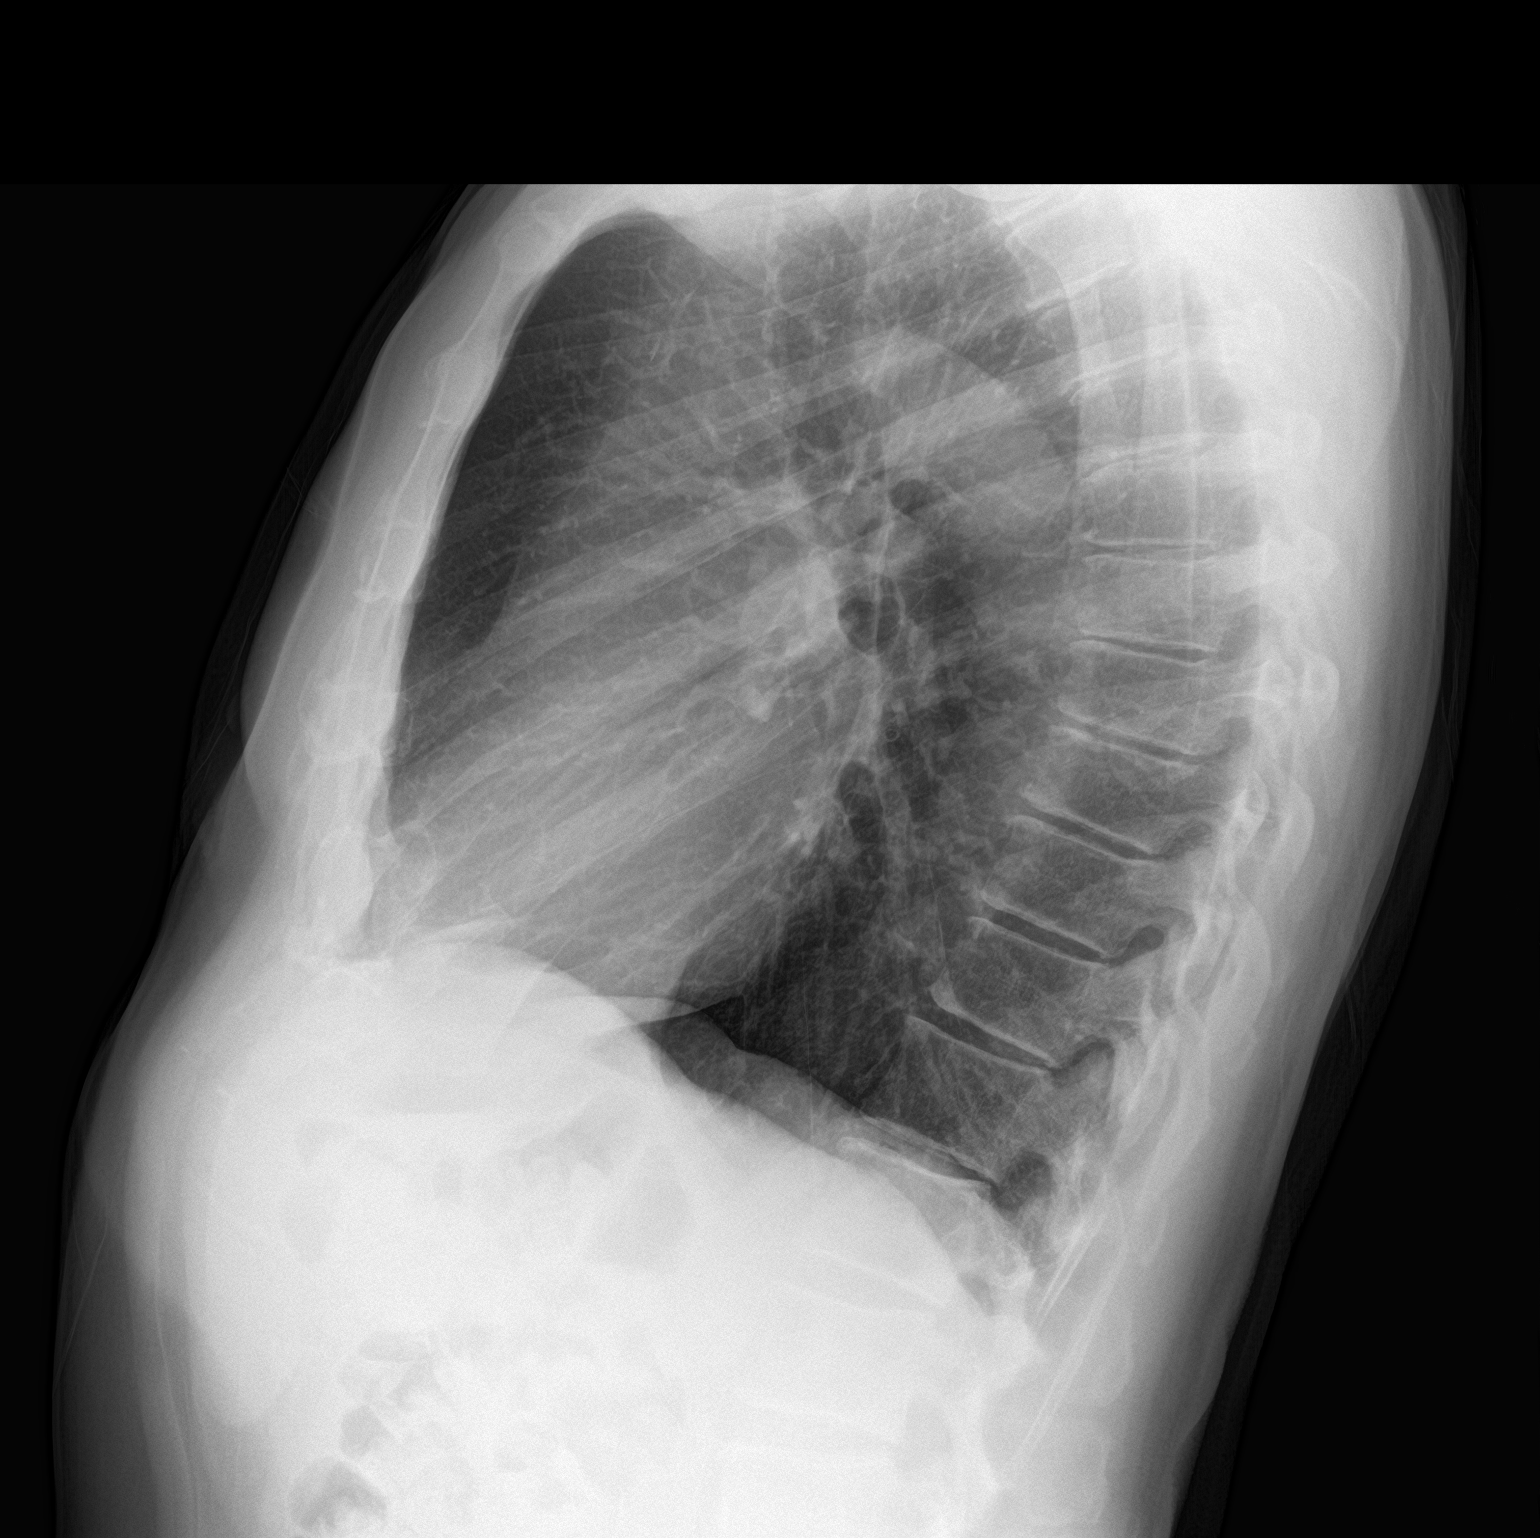

[2 of 2 positions shown; findings below may reference images not displayed]

FINDINGS: Heart and mediastinal contours are within normal limits. No focal
opacities or effusions. No acute bony abnormality.
IMPRESSION: Negative.

## 2022-04-18 ENCOUNTER — Encounter: Payer: Medicare HMO | Admitting: Family Medicine

## 2022-05-03 ENCOUNTER — Ambulatory Visit (INDEPENDENT_AMBULATORY_CARE_PROVIDER_SITE_OTHER): Payer: Medicare HMO | Admitting: Family Medicine

## 2022-05-03 ENCOUNTER — Encounter: Payer: Self-pay | Admitting: Family Medicine

## 2022-05-03 VITALS — BP 138/75 | HR 77 | Temp 97.5°F | Ht 73.0 in | Wt 180.0 lb

## 2022-05-03 DIAGNOSIS — Z0001 Encounter for general adult medical examination with abnormal findings: Secondary | ICD-10-CM | POA: Diagnosis not present

## 2022-05-03 DIAGNOSIS — E785 Hyperlipidemia, unspecified: Secondary | ICD-10-CM

## 2022-05-03 DIAGNOSIS — E538 Deficiency of other specified B group vitamins: Secondary | ICD-10-CM | POA: Diagnosis not present

## 2022-05-03 DIAGNOSIS — L578 Other skin changes due to chronic exposure to nonionizing radiation: Secondary | ICD-10-CM | POA: Insufficient documentation

## 2022-05-03 DIAGNOSIS — R5383 Other fatigue: Secondary | ICD-10-CM | POA: Diagnosis not present

## 2022-05-03 LAB — COMPREHENSIVE METABOLIC PANEL
ALT: 19 U/L (ref 0–53)
AST: 21 U/L (ref 0–37)
Albumin: 4.6 g/dL (ref 3.5–5.2)
Alkaline Phosphatase: 65 U/L (ref 39–117)
BUN: 21 mg/dL (ref 6–23)
CO2: 25 mEq/L (ref 19–32)
Calcium: 9.3 mg/dL (ref 8.4–10.5)
Chloride: 105 mEq/L (ref 96–112)
Creatinine, Ser: 1.02 mg/dL (ref 0.40–1.50)
GFR: 71.24 mL/min (ref >60.00)
Glucose, Bld: 100 mg/dL — ABNORMAL HIGH (ref 70–99)
Potassium: 4.1 mEq/L (ref 3.5–5.1)
Sodium: 140 mEq/L (ref 135–145)
Total Bilirubin: 0.7 mg/dL (ref 0.2–1.2)
Total Protein: 6.7 g/dL (ref 6.0–8.3)

## 2022-05-03 LAB — CBC
HCT: 45.3 % (ref 39.0–52.0)
Hemoglobin: 15.5 g/dL (ref 13.0–17.0)
MCHC: 34.1 g/dL (ref 30.0–36.0)
MCV: 91.8 fl (ref 78.0–100.0)
Platelets: 181 10*3/uL (ref 150.0–400.0)
RBC: 4.93 Mil/uL (ref 4.22–5.81)
RDW: 13.6 % (ref 11.5–15.5)
WBC: 4.9 10*3/uL (ref 4.0–10.5)

## 2022-05-03 LAB — LIPID PANEL
Cholesterol: 145 mg/dL (ref 0–200)
HDL: 65.3 mg/dL (ref >39.00)
LDL Cholesterol: 64 mg/dL (ref 0–99)
NonHDL: 79.97
Total CHOL/HDL Ratio: 2
Triglycerides: 82 mg/dL (ref 0.0–149.0)
VLDL: 16.4 mg/dL (ref 0.0–40.0)

## 2022-05-03 LAB — TSH: TSH: 0.69 u[IU]/mL (ref 0.35–5.50)

## 2022-05-03 LAB — HEMOGLOBIN A1C: Hgb A1c MFr Bld: 5.4 % (ref 4.6–6.5)

## 2022-05-03 LAB — VITAMIN B12: Vitamin B-12: 218 pg/mL (ref 211–911)

## 2022-05-03 NOTE — Patient Instructions (Addendum)
It was very nice to see you today!  We will check your blood work today.  Please keep up the great work with your diet and exercise.  Please go to the pharmacy to get your tetanus shot.  I will see you back in year for your next physical.  Come back to see [name] sooner if needed.b  Take care, Dr Jerline Pain  PLEASE NOTE:  If you had any lab tests, please let us know if you have not heard back within a few days. You may see your results on mychart before we have a chance to review them but we will give you a call once they are reviewed by [name].   If we ordered any referrals today, please let us know if you have not heard from their office within the next week.   If you had any urgent prescriptions sent in today, please check with the pharmacy within an hour of our visit to make sure the prescription was transmitted appropriately.   Please try these tips to maintain a healthy lifestyle:  Eat at least 3 REAL meals and 1-2 snacks per day.  Aim for no more than 5 hours between eating.  If you eat breakfast, please do so within one hour of getting up.   Each meal should contain half fruits/vegetables, one quarter protein, and one quarter carbs (no bigger than a computer mouse)  Cut down on sweet beverages. This includes juice, soda, and sweet tea.   Drink at least 1 glass of water with each meal and aim for at least 8 glasses per day  Exercise at least 150 minutes every week.    Preventive Care 42 Years and Older, Male Preventive care refers to lifestyle choices and visits with your health care provider that can promote health and wellness. Preventive care visits are also called wellness exams. What can I expect for my preventive care visit? Counseling During your preventive care visit, your health care provider may ask about your: Medical history, including: Past medical problems. Family medical history. History of falls. Current health, including: Emotional well-being. Home life and  relationship well-being. Sexual activity. Memory and ability to understand (cognition). Lifestyle, including: Alcohol, nicotine or tobacco, and drug use. Access to firearms. Diet, exercise, and sleep habits. Work and work Statistician. Sunscreen use. Safety issues such as seatbelt and bike helmet use. Physical exam Your health care provider will check your: Height and weight. These may be used to calculate your BMI (body mass index). BMI is a measurement that tells if you are at a healthy weight. Waist circumference. This measures the distance around your waistline. This measurement also tells if you are at a healthy weight and may help predict your risk of certain diseases, such as type 2 diabetes and high blood pressure. Heart rate and blood pressure. Body temperature. Skin for abnormal spots. What immunizations do I need?  Vaccines are usually given at various ages, according to a schedule. Your health care provider will recommend vaccines for you based on your age, medical history, and lifestyle or other factors, such as travel or where you work. What tests do I need? Screening Your health care provider may recommend screening tests for certain conditions. This may include: Lipid and cholesterol levels. Diabetes screening. This is done by checking your blood sugar (glucose) after you have not eaten for a while (fasting). Hepatitis C test. Hepatitis B test. HIV (human immunodeficiency virus) test. STI (sexually transmitted infection) testing, if you are at risk. Lung cancer  screening. Colorectal cancer screening. Prostate cancer screening. Abdominal aortic aneurysm (AAA) screening. You may need this if you are a current or former smoker. Talk with your health care provider about your test results, treatment options, and if necessary, the need for more tests. Follow these instructions at home: Eating and drinking  Eat a diet that includes fresh fruits and vegetables, whole  grains, lean protein, and low-fat dairy products. Limit your intake of foods with high amounts of sugar, saturated fats, and salt. Take vitamin and mineral supplements as recommended by your health care provider. Do not drink alcohol if your health care provider tells you not to drink. If you drink alcohol: Limit how much you have to 0-2 drinks a day. Know how much alcohol is in your drink. In the [place] one drink equals one 12 oz bottle of beer (355 mL), one 5 oz glass of wine (148 mL), or one 1 oz glass of hard liquor (44 mL). Lifestyle Brush your teeth every morning and night with fluoride toothpaste. Floss one time each day. Exercise for at least 30 minutes 5 or more days each week. Do not use any products that contain nicotine or tobacco. These products include cigarettes, chewing tobacco, and vaping devices, such as e-cigarettes. If you need help quitting, ask your health care provider. Do not use drugs. If you are sexually active, practice safe sex. Use a condom or other form of protection to prevent STIs. Take aspirin only as told by your health care provider. Make sure that you understand how much to take and what form to take. Work with your health care provider to find out whether it is safe and beneficial for you to take aspirin daily. Ask your health care provider if you need to take a cholesterol-lowering medicine (statin). Find healthy ways to manage stress, such as: Meditation, yoga, or listening to music. Journaling. Talking to a trusted person. Spending time with friends and family. Safety Always wear your seat belt while driving or riding in a vehicle. Do not drive: If you have been drinking alcohol. Do not ride with someone who has been drinking. When you are tired or distracted. While texting. If you have been using any mind-altering substances or drugs. Wear a helmet and other protective equipment during sports activities. If you have firearms in your house, make sure  you follow all gun safety procedures. Minimize exposure to UV radiation to reduce your risk of skin cancer. What's next? Visit your health care provider once a year for an annual wellness visit. Ask your health care provider how often you should have your eyes and teeth checked. Stay up to date on all vaccines. This information is not intended to replace advice given to you by your health care provider. Make sure you discuss any questions you have with your health care provider. Document Revised: 09/07/2020 Document Reviewed: 09/07/2020 Elsevier Patient Education  [place].

## 2022-05-03 NOTE — Assessment & Plan Note (Signed)
Could be contributing to his fatigue.  Check B12 level today.

## 2022-05-03 NOTE — Progress Notes (Signed)
Chief Complaint:  Justin Escobar is a 77 y.o. male who presents today for his annual comprehensive physical exam.    Assessment/Plan:  New/Acute Problems: Other Fatigue No red flags.  We did discuss importance of getting a good night sleep.  Will check labs today including CBC, c-Met, TSH, and B12.  Chronic Problems Addressed Today: Hyperlipidemia On simvastatin 40 mg daily.  Tolerating well.  Check labs today.  Low serum vitamin B12 Could be contributing to his fatigue.  Check B12 level today.  Preventative Healthcare: Due for colonoscopy in a couple of years.  Will check labs today.  He will go to the pharmacy to get Tdap.  Patient Counseling(The following topics were reviewed and/or handout was given):  -Nutrition: Stressed importance of moderation in sodium/caffeine intake, saturated fat and cholesterol, caloric balance, sufficient intake of fresh fruits, vegetables, and fiber.  -Stressed the importance of regular exercise.   -Substance Abuse: Discussed cessation/primary prevention of tobacco, alcohol, or other drug use; driving or other dangerous activities under the influence; availability of treatment for abuse.   -Injury prevention: Discussed safety belts, safety helmets, smoke detector, smoking near bedding or upholstery.   -Sexuality: Discussed sexually transmitted diseases, partner selection, use of condoms, avoidance of unintended pregnancy and contraceptive alternatives.   -Dental health: Discussed importance of regular tooth brushing, flossing, and dental visits.  -Health maintenance and immunizations reviewed. Please refer to Health maintenance section.  Return to care in 1 year for next preventative visit.     Subjective:  HPI:  He has no acute complaints today. See A/p for status of chronic conditions.   He has been having some ongoing issues with fatigue.   Lifestyle Diet: Balanced. Plenty of fruits and vegetables.  Exercise: Active with work.       05/03/2022    2:04 PM  Depression screen PHQ 2/9  Decreased Interest 0  Down, Depressed, Hopeless 0  PHQ - 2 Score 0    Health Maintenance Due  Topic Date Due   DTaP/Tdap/Td (1 - Tdap) Never done     ROS: Per HPI, otherwise a complete review of systems was negative.   PMH:  The following were reviewed and entered/updated in epic: Past Medical History:  Diagnosis Date   History of basal cell carcinoma (BCC) of skin    Hyperlipidemia    Rhinitis    Patient Active Problem List   Diagnosis Date Noted   Chronic rhinitis 04/28/2021   Hallux rigidus, left foot    Low serum vitamin B12 01/13/2019   History of skin cancer 07/09/2018   Hyperlipidemia 07/09/2018   Past Surgical History:  Procedure Laterality Date   ARTHRODESIS METATARSALPHALANGEAL JOINT (MTPJ) Left 05/19/2019   Procedure: LEFT 1ST METATARSALPHALANGEAL JOINT FUSION;  Surgeon: Newt Minion, MD;  Location: [facility];  Service: Orthopedics;  Laterality: Left;   MOHS SURGERY      Family History  Problem Relation Age of Onset   Alzheimer's disease Mother    Cancer Neg Hx     Medications- reviewed and updated Current Outpatient Medications  Medication Sig Dispense Refill   nystatin (MYCOSTATIN) 100000 UNIT/ML suspension Use as directed 5 mLs (500,000 Units total) in the mouth or throat 4 (four) times daily. Swish for at least 2-3 minutes and then spit. Retain in mouth longer if able. 180 mL 0   simvastatin (ZOCOR) 40 MG tablet Take 1 tablet by mouth once daily 90 tablet 0   No current facility-administered medications for this visit.  Allergies-reviewed and updated Allergies  Allergen Reactions   5-Alpha Reductase Inhibitors    Latex Rash    Social History   Socioeconomic History   Marital status: Married    Spouse name: Not on file   Number of children: Not on file   Years of education: Not on file   Highest education level: Not on file  Occupational History   Not on file   Tobacco Use   Smoking status: Former   Smokeless tobacco: Never  Substance and Sexual Activity   Alcohol use: Yes    Comment: 6-7 drinks per week   Drug use: Never   Sexual activity: Not on file  Other Topics Concern   Not on file  Social History Narrative   Not on file   Social Determinants of Health   Financial Resource Strain: Low Risk  (03/20/2022)   Overall Financial Resource Strain (CARDIA)    Difficulty of Paying Living Expenses: Not hard at all  Food Insecurity: No Food Insecurity (03/20/2022)   Hunger Vital Sign    Worried About Running Out of Food in the Last Year: Never true    H[place] in the Last Year: Never true  Transportation Needs: No Transportation Needs (03/20/2022)   PRAPARE - Hydrologist (Medical): No    Lack of Transportation (Non-Medical): No  Physical Activity: Inactive (03/20/2022)   Exercise Vital Sign    Days of Exercise per Week: 0 days    Minutes of Exercise per Session: 0 min  Stress: No Stress Concern Present (03/20/2022)   [place]    Feeling of Stress : Not at all  Social Connections: Moderately Integrated (03/20/2022)   Social Connection and Isolation Panel [NHANES]    Frequency of Communication with Friends and Family: More than three times a week    Frequency of Social Gatherings with Friends and Family: More than three times a week    Attends Religious Services: Never    Marine scientist or Organizations: Yes    Attends Music therapist: 1 to 4 times per year    Marital Status: Married        Objective:  Physical Exam: BP 138/75   Pulse 77   Temp (!) 97.5 F (36.4 C) (Temporal)   Ht '6\' 1"'$  (1.854 m)   Wt 180 lb (81.6 kg)   SpO2 96%   BMI 23.75 kg/m   Body mass index is 23.75 kg/m. Wt Readings from Last 3 Encounters:  05/03/22 180 lb (81.6 kg)  04/05/22 179 lb 9.6 oz (81.5 kg)  03/20/22 182 lb (82.6  kg)   Gen: NAD, resting comfortably HEENT: TMs normal bilaterally. OP clear. No thyromegaly noted.  CV: RRR with no murmurs appreciated Pulm: NWOB, CTAB with no crackles, wheezes, or rhonchi GI: Normal bowel sounds present. Soft, Nontender, Nondistended. MSK: no edema, cyanosis, or clubbing noted Skin: warm, dry Neuro: CN2-12 grossly intact. Strength 5/5 in upper and lower extremities. Reflexes symmetric and intact bilaterally.  Psych: Normal affect and thought content     Justin Escobar M. Jerline Pain, MD 05/03/2022 2:29 PM

## 2022-05-03 NOTE — Assessment & Plan Note (Signed)
On simvastatin 40 mg daily.  Tolerating well.  Check labs today.

## 2022-05-04 NOTE — Progress Notes (Signed)
Please inform patient of the following:  B12 is on the lower range of normal.  This could be contributing some to his fatigue.  All of his other labs are stable.  Recommend he start B12 supplementation 1000 mcg daily or come here to start the B12 replacement protocol.  We should recheck his B12 in 3 to 6 months.  We can recheck everything else in year.

## 2022-07-06 ENCOUNTER — Telehealth: Payer: Self-pay | Admitting: Family Medicine

## 2022-07-06 MED ORDER — SIMVASTATIN 40 MG PO TABS: 40.0000 mg | ORAL_TABLET | Freq: Every day | ORAL | 0 refills | Status: DC

## 2022-07-06 NOTE — Telephone Encounter (Signed)
Spoke to pt told him Rx for Simvastatin was sent to pharmacy. Pt verbalized understanding. Rx sent

## 2022-07-06 NOTE — Telephone Encounter (Signed)
Prescription Request  Previously prescribed by prior PCP  07/06/2022  LOV: 05/03/2022  What is the name of the medication or equipment?  simvastatin (ZOCOR) 40 MG tablet   Have you contacted your pharmacy to request a refill? No  Previously prescribed by prior PCP  Which pharmacy would you like this sent to?  [facility] [address] [place], [place] - [address] [place] [address] Phone: [phone] Fax: [phone]    Patient notified that their request is being sent to the clinical staff for review and that they should receive a response within 2 business days.   Please advise at Mobile [phone] (mobile)

## 2022-07-24 DIAGNOSIS — D2261 Melanocytic nevi of right upper limb, including shoulder: Secondary | ICD-10-CM | POA: Diagnosis not present

## 2022-07-24 DIAGNOSIS — Z85828 Personal history of other malignant neoplasm of skin: Secondary | ICD-10-CM | POA: Diagnosis not present

## 2022-07-24 DIAGNOSIS — D1801 Hemangioma of skin and subcutaneous tissue: Secondary | ICD-10-CM | POA: Diagnosis not present

## 2022-07-24 DIAGNOSIS — L820 Inflamed seborrheic keratosis: Secondary | ICD-10-CM | POA: Diagnosis not present

## 2022-07-24 DIAGNOSIS — D2271 Melanocytic nevi of right lower limb, including hip: Secondary | ICD-10-CM | POA: Diagnosis not present

## 2022-07-24 DIAGNOSIS — L570 Actinic keratosis: Secondary | ICD-10-CM | POA: Diagnosis not present

## 2022-07-24 DIAGNOSIS — D225 Melanocytic nevi of trunk: Secondary | ICD-10-CM | POA: Diagnosis not present

## 2022-07-24 DIAGNOSIS — L821 Other seborrheic keratosis: Secondary | ICD-10-CM | POA: Diagnosis not present

## 2022-08-07 DIAGNOSIS — H5212 Myopia, left eye: Secondary | ICD-10-CM | POA: Diagnosis not present

## 2022-08-07 DIAGNOSIS — H02834 Dermatochalasis of left upper eyelid: Secondary | ICD-10-CM | POA: Diagnosis not present

## 2022-08-07 DIAGNOSIS — H524 Presbyopia: Secondary | ICD-10-CM | POA: Diagnosis not present

## 2022-08-07 DIAGNOSIS — H02831 Dermatochalasis of right upper eyelid: Secondary | ICD-10-CM | POA: Diagnosis not present

## 2022-08-15 DIAGNOSIS — Z85828 Personal history of other malignant neoplasm of skin: Secondary | ICD-10-CM | POA: Diagnosis not present

## 2022-08-15 DIAGNOSIS — Z008 Encounter for other general examination: Secondary | ICD-10-CM | POA: Diagnosis not present

## 2022-08-15 DIAGNOSIS — J309 Allergic rhinitis, unspecified: Secondary | ICD-10-CM | POA: Diagnosis not present

## 2022-08-15 DIAGNOSIS — Z8616 Personal history of COVID-19: Secondary | ICD-10-CM | POA: Diagnosis not present

## 2022-08-15 DIAGNOSIS — Z87891 Personal history of nicotine dependence: Secondary | ICD-10-CM | POA: Diagnosis not present

## 2022-08-15 DIAGNOSIS — J449 Chronic obstructive pulmonary disease, unspecified: Secondary | ICD-10-CM | POA: Diagnosis not present

## 2022-08-15 DIAGNOSIS — M2020 Hallux rigidus, unspecified foot: Secondary | ICD-10-CM | POA: Diagnosis not present

## 2022-08-15 DIAGNOSIS — Z818 Family history of other mental and behavioral disorders: Secondary | ICD-10-CM | POA: Diagnosis not present

## 2022-08-15 DIAGNOSIS — I10 Essential (primary) hypertension: Secondary | ICD-10-CM | POA: Diagnosis not present

## 2022-08-15 DIAGNOSIS — E785 Hyperlipidemia, unspecified: Secondary | ICD-10-CM | POA: Diagnosis not present

## 2022-09-30 ENCOUNTER — Other Ambulatory Visit: Payer: Self-pay | Admitting: Family Medicine

## 2023-01-02 ENCOUNTER — Other Ambulatory Visit: Payer: Self-pay | Admitting: Family Medicine

## 2023-01-17 DIAGNOSIS — D692 Other nonthrombocytopenic purpura: Secondary | ICD-10-CM | POA: Diagnosis not present

## 2023-01-17 DIAGNOSIS — C44519 Basal cell carcinoma of skin of other part of trunk: Secondary | ICD-10-CM | POA: Diagnosis not present

## 2023-01-17 DIAGNOSIS — Z85828 Personal history of other malignant neoplasm of skin: Secondary | ICD-10-CM | POA: Diagnosis not present

## 2023-01-17 DIAGNOSIS — D1801 Hemangioma of skin and subcutaneous tissue: Secondary | ICD-10-CM | POA: Diagnosis not present

## 2023-01-17 DIAGNOSIS — L821 Other seborrheic keratosis: Secondary | ICD-10-CM | POA: Diagnosis not present

## 2023-01-17 DIAGNOSIS — D225 Melanocytic nevi of trunk: Secondary | ICD-10-CM | POA: Diagnosis not present

## 2023-01-17 DIAGNOSIS — L570 Actinic keratosis: Secondary | ICD-10-CM | POA: Diagnosis not present

## 2023-02-08 DIAGNOSIS — J40 Bronchitis, not specified as acute or chronic: Secondary | ICD-10-CM | POA: Diagnosis not present

## 2023-02-08 DIAGNOSIS — R112 Nausea with vomiting, unspecified: Secondary | ICD-10-CM | POA: Diagnosis not present

## 2023-02-08 DIAGNOSIS — R197 Diarrhea, unspecified: Secondary | ICD-10-CM | POA: Diagnosis not present

## 2023-02-08 DIAGNOSIS — J019 Acute sinusitis, unspecified: Secondary | ICD-10-CM | POA: Diagnosis not present

## 2023-04-03 ENCOUNTER — Ambulatory Visit (INDEPENDENT_AMBULATORY_CARE_PROVIDER_SITE_OTHER): Payer: Medicare HMO

## 2023-04-03 VITALS — Wt 180.0 lb

## 2023-04-03 DIAGNOSIS — Z0000 Encounter for general adult medical examination without abnormal findings: Secondary | ICD-10-CM

## 2023-04-03 NOTE — Patient Instructions (Signed)
 Justin Escobar , Thank you for taking time to come for your Medicare Wellness Visit. I appreciate your ongoing commitment to your health goals. Please review the following plan we discussed and let me know if I can assist you in the future.   Referrals/Orders/Follow-Ups/Clinician Recommendations: Aim for 30 minutes of exercise or brisk walking, 6-8 glasses of water, and 5 servings of fruits and vegetables each day.   This is a list of the screening recommended for you and due dates:  Health Maintenance  Topic Date Due   DTaP/Tdap/Td vaccine (1 - Tdap) Never done   Flu Shot  10/25/2022   COVID-19 Vaccine (7 - 2024-25 season) 11/25/2022   Medicare Annual Wellness Visit  03/21/2023   Colon Cancer Screening  02/02/2025   Pneumonia Vaccine  Completed   Hepatitis C Screening  Completed   Zoster (Shingles) Vaccine  Completed   HPV Vaccine  Aged Out    Advanced directives: (Declined) Advance directive discussed with you today. Even though you declined this today, please call our office should you change your mind, and we can give you the proper paperwork for you to fill out.  Next Medicare Annual Wellness Visit scheduled for next year: Yes

## 2023-04-03 NOTE — Progress Notes (Signed)
 Subjective:   Justin Escobar is a 78 y.o. male who presents for Medicare Annual/Subsequent preventive examination.  Visit Complete: Virtual I connected with  Bobbie Vandeberg on 04/03/23 by a audio enabled telemedicine application and verified that I am speaking with the correct person using two identifiers.  Patient Location: Home  Provider Location: Home Office  I discussed the limitations of evaluation and management by telemedicine. The patient expressed understanding and agreed to proceed.  Vital Signs: Because this visit was a virtual/telehealth visit, some criteria may be missing or patient reported. Any vitals not documented were not able to be obtained and vitals that have been documented are patient reported.   Cardiac Risk Factors include: advanced age (>58men, >96 women);dyslipidemia;male gender     Objective:    Today's Vitals   04/03/23 1539  Weight: 180 lb (81.6 kg)   Body mass index is 23.75 kg/m.     04/03/2023    3:49 PM 03/20/2022    2:59 PM 03/02/2021    3:51 PM 05/19/2019    7:27 AM 05/12/2019    2:01 PM  Advanced Directives  Does Patient Have a Medical Advance Directive? No Yes No Yes Yes  Type of Special Educational Needs Teacher of [place];Living will   Healthcare Power of [place];Living will  Does patient want to make changes to medical advance directive?   Yes (MAU/Ambulatory/Procedural Areas - Information given)  No - Patient declined  Copy of Healthcare Power of Attorney in Chart?  No - copy requested     Would patient like information on creating a medical advance directive? No - Patient declined        Current Medications (verified) Outpatient Encounter Medications as of 04/03/2023  Medication Sig   AREXVY 120 MCG/0.5ML injection    COMIRNATY syringe    Cyanocobalamin  (VITAMIN B 12 PO) Take by mouth.   FLUZONE HIGH-DOSE 0.5 ML injection    simvastatin  (ZOCOR ) 40 MG tablet Take 1 tablet by mouth once daily   [DISCONTINUED] nystatin   (MYCOSTATIN ) 100000 UNIT/ML suspension Use as directed 5 mLs (500,000 Units total) in the mouth or throat 4 (four) times daily. Swish for at least 2-3 minutes and then spit. Retain in mouth longer if able.   No facility-administered encounter medications on file as of 04/03/2023.    Allergies (verified) 5-alpha reductase inhibitors and Latex   History: Past Medical History:  Diagnosis Date   History of basal cell carcinoma (BCC) of skin    Hyperlipidemia    Rhinitis    Past Surgical History:  Procedure Laterality Date   ARTHRODESIS METATARSALPHALANGEAL JOINT (MTPJ) Left 05/19/2019   Procedure: LEFT 1ST METATARSALPHALANGEAL JOINT FUSION;  Surgeon: Harden Jerona GAILS, MD;  Location: [facility];  Service: Orthopedics;  Laterality: Left;   MOHS SURGERY     Family History  Problem Relation Age of Onset   Alzheimer's disease Mother    Cancer Neg Hx    Social History   Socioeconomic History   Marital status: Married    Spouse name: Not on file   Number of children: Not on file   Years of education: Not on file   Highest education level: Not on file  Occupational History   Not on file  Tobacco Use   Smoking status: Former   Smokeless tobacco: Never  Substance and Sexual Activity   Alcohol use: Yes    Comment: 6-7 drinks per week   Drug use: Never   Sexual activity: Not on file  Other  Topics Concern   Not on file  Social History Narrative   Not on file   Social Drivers of Health   Financial Resource Strain: Low Risk  (04/03/2023)   Overall Financial Resource Strain (CARDIA)    Difficulty of Paying Living Expenses: Not hard at all  Food Insecurity: No Food Insecurity (04/03/2023)   Hunger Vital Sign    Worried About Running Out of Food in the Last Year: Never true    Ran Out of Food in the Last Year: Never true  Transportation Needs: No Transportation Needs (04/03/2023)   PRAPARE - Administrator, Civil Service (Medical): No    Lack of Transportation  (Non-Medical): No  Physical Activity: Inactive (04/03/2023)   Exercise Vital Sign    Days of Exercise per Week: 0 days    Minutes of Exercise per Session: 0 min  Stress: No Stress Concern Present (04/03/2023)   [employer] of Occupational Health - Occupational Stress Questionnaire    Feeling of Stress : Not at all  Social Connections: Moderately Integrated (04/03/2023)   Social Connection and Isolation Panel [NHANES]    Frequency of Communication with Friends and Family: Three times a week    Frequency of Social Gatherings with Friends and Family: Three times a week    Attends Religious Services: Never    Active Member of Clubs or Organizations: Yes    Attends Banker Meetings: 1 to 4 times per year    Marital Status: Married    Tobacco Counseling Counseling given: Not Answered   Clinical Intake:  Pre-visit preparation completed: Yes  Pain : No/denies pain     BMI - recorded: 23.75 Nutritional Status: BMI of 19-24  Normal Nutritional Risks: None Diabetes: No  How often do you need to have someone help you when you read instructions, pamphlets, or other written materials from your doctor or pharmacy?: 1 - Never  Interpreter Needed?: No  Information entered by :: Ellouise Haws, LPN   Activities of Daily Living    04/03/2023    3:46 PM  In your present state of health, do you have any difficulty performing the following activities:  Hearing? 0  Vision? 0  Difficulty concentrating or making decisions? 0  Walking or climbing stairs? 0  Dressing or bathing? 0  Doing errands, shopping? 0  Preparing Food and eating ? N  Using the Toilet? N  In the past six months, have you accidently leaked urine? N  Do you have problems with loss of bowel control? N  Managing your Medications? N  Managing your Finances? N  Housekeeping or managing your Housekeeping? N    Patient Care Team: Kennyth Worth HERO, MD as PCP - General (Family Medicine)  Indicate any recent  Medical Services you may have received from other than [facility] providers in the past year (date may be approximate).     Assessment:   This is a routine wellness examination for Justin Escobar.  Hearing/Vision screen Hearing Screening - Comments:: Pt denies any hearing issues  Vision Screening - Comments:: Pt follows up with dr Robinson for annual ey exams    Goals Addressed             This Visit's Progress    Patient Stated       More exercise        Depression Screen    04/03/2023    3:49 PM 05/03/2022    2:04 PM 04/05/2022    2:15 PM 03/20/2022  2:56 PM 01/01/2022    2:06 PM 04/17/2021    2:43 PM 03/02/2021    3:55 PM  PHQ 2/9 Scores  PHQ - 2 Score 0 0 0 0 0 0 0    Fall Risk    04/03/2023    3:51 PM 05/03/2022    2:04 PM 04/05/2022    2:15 PM 03/20/2022    2:59 PM 01/01/2022    2:06 PM  Fall Risk   Falls in the past year? 0 0 0 0 0  Number falls in past yr: 0 0 0 0 0  Injury with Fall? 0 0 0 0 0  Risk for fall due to : No Fall Risks No Fall Risks No Fall Risks Impaired vision;Impaired balance/gait No Fall Risks  Follow up Falls prevention discussed   Falls prevention discussed     MEDICARE RISK AT HOME: Medicare Risk at Home Any stairs in or around the home?: No If so, are there any without handrails?: No Home free of loose throw rugs in walkways, pet beds, electrical cords, etc?: Yes Adequate lighting in your home to reduce risk of falls?: Yes Life alert?: No Use of a cane, walker or w/c?: No Grab bars in the bathroom?: Yes Shower chair or bench in shower?: Yes Elevated toilet seat or a handicapped toilet?: No  TIMED UP AND GO:  Was the test performed?  No    Cognitive Function:        04/03/2023    3:51 PM 03/20/2022    3:00 PM  6CIT Screen  What Year? 0 points 0 points  What month? 0 points 0 points  What time? 0 points 0 points  Count back from 20 0 points 0 points  Months in reverse 0 points 0 points  Repeat phrase 0 points 0 points  Total Score 0 points  0 points    Immunizations Immunization History  Administered Date(s) Administered   Fluad Quad(high Dose 65+) 12/16/2021   Influenza, High Dose Seasonal PF 12/22/2020   Influenza-Unspecified 04/27/2015, 12/09/2018, 12/08/2022   PFIZER Comirnaty(Gray Top)Covid-19 Tri-Sucrose Vaccine 12/27/2021   PFIZER(Purple Top)SARS-COV-2 Vaccination 04/19/2019, 05/11/2019, 01/10/2020, 08/04/2020   PNEUMOCOCCAL CONJUGATE-20 01/23/2021   Pfizer Covid-19 Vaccine Bivalent Booster 69yrs & up 01/23/2021   Pfizer(Comirnaty)Fall Seasonal Vaccine 12 years and older 12/08/2022   RSV,unspecified 11/11/2022   Zoster Recombinant(Shingrix) 07/30/2019, 11/01/2019    TDAP status: Due, Education has been provided regarding the importance of this vaccine. Advised may receive this vaccine at local pharmacy or Health Dept. Aware to provide a copy of the vaccination record if obtained from local pharmacy or Health Dept. Verbalized acceptance and understanding.  Flu Vaccine status: Up to date  Pneumococcal vaccine status: Up to date  Covid-19 vaccine status: Information provided on how to obtain vaccines.   Qualifies for Shingles Vaccine? Yes   Zostavax completed Yes   Shingrix Completed?: Yes  Screening Tests Health Maintenance  Topic Date Due   DTaP/Tdap/Td (1 - Tdap) Never done   Medicare Annual Wellness (AWV)  04/02/2024   Colonoscopy  02/02/2025   Pneumonia Vaccine 43+ Years old  Completed   INFLUENZA VACCINE  Completed   COVID-19 Vaccine  Completed   Hepatitis C Screening  Completed   Zoster Vaccines- Shingrix  Completed   HPV VACCINES  Aged Out    Health Maintenance  Health Maintenance Due  Topic Date Due   DTaP/Tdap/Td (1 - Tdap) Never done    Colorectal cancer screening: Type of screening: Colonoscopy. Completed 02/02/25. Repeat every  10 years   Additional Screening:  Hepatitis C Screening:  Completed 01/13/19  Vision Screening: Recommended annual ophthalmology exams for early detection  of glaucoma and other disorders of the eye. Is the patient up to date with their annual eye exam?  Yes  Who is the provider or what is the name of the office in which the patient attends annual eye exams? Dr Robinson  If pt is not established with a provider, would they like to be referred to a provider to establish care? No .   Dental Screening: Recommended annual dental exams for proper oral hygiene   Community Resource Referral / Chronic Care Management: CRR required this visit?  No   CCM required this visit?  No     Plan:     I have personally reviewed and noted the following in the patient's chart:   Medical and social history Use of alcohol, tobacco or illicit drugs  Current medications and supplements including opioid prescriptions. Patient is not currently taking opioid prescriptions. Functional ability and status Nutritional status Physical activity Advanced directives List of other physicians Hospitalizations, surgeries, and ER visits in previous 12 months Vitals Screenings to include cognitive, depression, and falls Referrals and appointments  In addition, I have reviewed and discussed with patient certain preventive protocols, quality metrics, and best practice recommendations. A written personalized care plan for preventive services as well as general preventive health recommendations were provided to patient.     Ellouise VEAR Haws, LPN   10/24/7972   After Visit Summary: (MyChart) Due to this being a telephonic visit, the after visit summary with patients personalized plan was offered to patient via MyChart   Nurse Notes: none

## 2023-04-05 ENCOUNTER — Other Ambulatory Visit: Payer: Self-pay | Admitting: Family Medicine

## 2023-04-29 DIAGNOSIS — H00012 Hordeolum externum right lower eyelid: Secondary | ICD-10-CM | POA: Diagnosis not present

## 2023-05-06 ENCOUNTER — Ambulatory Visit (INDEPENDENT_AMBULATORY_CARE_PROVIDER_SITE_OTHER): Payer: Medicare HMO | Admitting: Family Medicine

## 2023-05-06 ENCOUNTER — Encounter: Payer: Self-pay | Admitting: Family Medicine

## 2023-05-06 VITALS — BP 134/77 | HR 76 | Temp 97.6°F | Ht 73.0 in | Wt 187.0 lb

## 2023-05-06 DIAGNOSIS — E785 Hyperlipidemia, unspecified: Secondary | ICD-10-CM

## 2023-05-06 DIAGNOSIS — Z0001 Encounter for general adult medical examination with abnormal findings: Secondary | ICD-10-CM | POA: Diagnosis not present

## 2023-05-06 DIAGNOSIS — Z131 Encounter for screening for diabetes mellitus: Secondary | ICD-10-CM | POA: Diagnosis not present

## 2023-05-06 DIAGNOSIS — E538 Deficiency of other specified B group vitamins: Secondary | ICD-10-CM | POA: Diagnosis not present

## 2023-05-06 MED ORDER — POLYMYXIN B-TRIMETHOPRIM 10000-0.1 UNIT/ML-% OP SOLN: 2.0000 [drp] | OPHTHALMIC | 0 refills | Status: AC

## 2023-05-06 NOTE — Progress Notes (Signed)
 Chief Complaint:  Justin Escobar is a 78 y.o. male who presents today for his annual comprehensive physical exam.    Assessment/Plan:  New/Acute Problems: Stye No red flags.  Will start Polytrim  drops.  Recommended warm compresses.  He will let us  know if not proving in the next 1 to 2 weeks and we can refer to ophthalmology.  Chronic Problems Addressed Today: Low serum vitamin B12 Check B12.   Hyperlipidemia Check lipids.  He is on simvastatin  40 mg daily and tolerating well.  Preventative Healthcare: Check labs.  Up-to-date on vaccines.  Due for colon cancer screening next month.  Patient Counseling(The following topics were reviewed and/or handout was given):  -Nutrition: Stressed importance of moderation in sodium/caffeine  intake, saturated fat and cholesterol, caloric balance, sufficient intake of fresh fruits, vegetables, and fiber.  -Stressed the importance of regular exercise.   -Substance Abuse: Discussed cessation/primary prevention of tobacco, alcohol, or other drug use; driving or other dangerous activities under the influence; availability of treatment for abuse.   -Injury prevention: Discussed safety belts, safety helmets, smoke detector, smoking near bedding or upholstery.   -Sexuality: Discussed sexually transmitted diseases, partner selection, use of condoms, avoidance of unintended pregnancy and contraceptive alternatives.   -Dental health: Discussed importance of regular tooth brushing, flossing, and dental visits.  -Health maintenance and immunizations reviewed. Please refer to Health maintenance section.  Return to care in 1 year for next preventative visit.     Subjective:  HPI:  See Assessment / plan for status of chronic conditions. He has been having a raised red bump to his right lower eyelid.  Was painful and irritated but does seem to be improving.  He was given a Z-Pak by his optometrist week or so ago.  He has not had any significant change since  then.  Lifestyle Diet: Plenty of fruits and vegetables. Trying to get more lean protein with chicken and fish.  Exercise: None specific but busy with work 5 hours per day.      05/06/2023    2:08 PM  Depression screen PHQ 2/9  Decreased Interest 0  Down, Depressed, Hopeless 0  PHQ - 2 Score 0    There are no preventive care reminders to display for this patient.   ROS: Per HPI, otherwise a complete review of systems was negative.   PMH:  The following were reviewed and entered/updated in epic: Past Medical History:  Diagnosis Date   History of basal cell carcinoma (BCC) of skin    Hyperlipidemia    Rhinitis    Patient Active Problem List   Diagnosis Date Noted   Chronic rhinitis 04/28/2021   Hallux rigidus, left foot    Low serum vitamin B12 01/13/2019   History of skin cancer 07/09/2018   Hyperlipidemia 07/09/2018   Past Surgical History:  Procedure Laterality Date   ARTHRODESIS METATARSALPHALANGEAL JOINT (MTPJ) Left 05/19/2019   Procedure: LEFT 1ST METATARSALPHALANGEAL JOINT FUSION;  Surgeon: Timothy Ford, MD;  Location: [facility];  Service: Orthopedics;  Laterality: Left;   MOHS SURGERY      Family History  Problem Relation Age of Onset   Alzheimer's disease Mother    Cancer Neg Hx     Medications- reviewed and updated Current Outpatient Medications  Medication Sig Dispense Refill   Cyanocobalamin  (VITAMIN B 12 PO) Take by mouth.     simvastatin  (ZOCOR ) 40 MG tablet Take 1 tablet by mouth once daily 90 tablet 0   trimethoprim -polymyxin b  (POLYTRIM ) ophthalmic solution  Place 2 drops into the right eye every 4 (four) hours. 10 mL 0   No current facility-administered medications for this visit.    Allergies-reviewed and updated Allergies  Allergen Reactions   5-Alpha Reductase Inhibitors    Latex Rash    Social History   Socioeconomic History   Marital status: Married    Spouse name: Not on file   Number of children: Not on file    Years of education: Not on file   Highest education level: Not on file  Occupational History   Not on file  Tobacco Use   Smoking status: Former   Smokeless tobacco: Never  Substance and Sexual Activity   Alcohol use: Yes    Comment: 6-7 drinks per week   Drug use: Never   Sexual activity: Not on file  Other Topics Concern   Not on file  Social History Narrative   Not on file   Social Drivers of Health   Financial Resource Strain: Low Risk  (04/03/2023)   Overall Financial Resource Strain (CARDIA)    Difficulty of Paying Living Expenses: Not hard at all  Food Insecurity: No Food Insecurity (04/03/2023)   Hunger Vital Sign    Worried About Running Out of Food in the Last Year: Never true    Ran Out of Food in the Last Year: Never true  Transportation Needs: No Transportation Needs (04/03/2023)   PRAPARE - Administrator, Civil Service (Medical): No    Lack of Transportation (Non-Medical): No  Physical Activity: Inactive (04/03/2023)   Exercise Vital Sign    Days of Exercise per Week: 0 days    Minutes of Exercise per Session: 0 min  Stress: No Stress Concern Present (04/03/2023)   [employer] of Occupational Health - Occupational Stress Questionnaire    Feeling of Stress : Not at all  Social Connections: Moderately Integrated (04/03/2023)   Social Connection and Isolation Panel [NHANES]    Frequency of Communication with Friends and Family: Three times a week    Frequency of Social Gatherings with Friends and Family: Three times a week    Attends Religious Services: Never    Active Member of Clubs or Organizations: Yes    Attends Banker Meetings: 1 to 4 times per year    Marital Status: Married        Objective:  Physical Exam: BP 134/77   Pulse 76   Temp 97.6 F (36.4 C) (Temporal)   Ht 6\' 1"  (1.854 m)   Wt 187 lb (84.8 kg)   SpO2 96%   BMI 24.67 kg/m   Body mass index is 24.67 kg/m. Wt Readings from Last 3 Encounters:  05/06/23 187  lb (84.8 kg)  04/03/23 180 lb (81.6 kg)  05/03/22 180 lb (81.6 kg)   Gen: NAD, resting comfortably HEENT: Extraocular eye movements intact without pain.  Radiates approximately 6 to 7 mm erythematous nodule in left lower medial eyelid consistent with hordeolum internum.  TMs normal bilaterally. OP clear. No thyromegaly noted.  CV: RRR with no murmurs appreciated Pulm: NWOB, CTAB with no crackles, wheezes, or rhonchi GI: Normal bowel sounds present. Soft, Nontender, Nondistended. MSK: no edema, cyanosis, or clubbing noted Skin: warm, dry Neuro: CN2-12 grossly intact. Strength 5/5 in upper and lower extremities. Reflexes symmetric and intact bilaterally.  Psych: Normal affect and thought content     Jermaine Tholl M. Daneil Dunker, MD 05/06/2023 2:28 PM

## 2023-05-06 NOTE — Assessment & Plan Note (Signed)
 Check B12

## 2023-05-06 NOTE — Patient Instructions (Signed)
 It was very nice to see you today!  You have a stye in your right eye.  Please start the drops.  Use warm compresses.  Let us  know if not improving in the next 1 to 2 weeks.  Will check blood work today.  Please continue to work on diet and exercise.  Return in about 1 year (around 05/05/2024) for Annual Physical.   Take care, Dr Daneil Dunker  PLEASE NOTE:  If you had any lab tests, please let us  know if you have not heard back within a few days. You may see your results on mychart before we have a chance to review them but we will give you a call once they are reviewed by us .   If we ordered any referrals today, please let us  know if you have not heard from their office within the next week.   If you had any urgent prescriptions sent in today, please check with the pharmacy within an hour of our visit to make sure the prescription was transmitted appropriately.   Please try these tips to maintain a healthy lifestyle:  Eat at least 3 REAL meals and 1-2 snacks per day.  Aim for no more than 5 hours between eating.  If you eat breakfast, please do so within one hour of getting up.   Each meal should contain half fruits/vegetables, one quarter protein, and one quarter carbs (no bigger than a computer mouse)  Cut down on sweet beverages. This includes juice, soda, and sweet tea.   Drink at least 1 glass of water with each meal and aim for at least 8 glasses per day  Exercise at least 150 minutes every week.    Preventive Care 63 Years and Older, Male Preventive care refers to lifestyle choices and visits with your health care provider that can promote health and wellness. Preventive care visits are also called wellness exams. What can I expect for my preventive care visit? Counseling During your preventive care visit, your health care provider may ask about your: Medical history, including: Past medical problems. Family medical history. History of falls. Current health,  including: Emotional well-being. Home life and relationship well-being. Sexual activity. Memory and ability to understand (cognition). Lifestyle, including: Alcohol, nicotine or tobacco, and drug use. Access to firearms. Diet, exercise, and sleep habits. Work and work Astronomer. Sunscreen use. Safety issues such as seatbelt and bike helmet use. Physical exam Your health care provider will check your: Height and weight. These may be used to calculate your BMI (body mass index). BMI is a measurement that tells if you are at a healthy weight. Waist circumference. This measures the distance around your waistline. This measurement also tells if you are at a healthy weight and may help predict your risk of certain diseases, such as type 2 diabetes and high blood pressure. Heart rate and blood pressure. Body temperature. Skin for abnormal spots. What immunizations do I need?  Vaccines are usually given at various ages, according to a schedule. Your health care provider will recommend vaccines for you based on your age, medical history, and lifestyle or other factors, such as travel or where you work. What tests do I need? Screening Your health care provider may recommend screening tests for certain conditions. This may include: Lipid and cholesterol levels. Diabetes screening. This is done by checking your blood sugar (glucose) after you have not eaten for a while (fasting). Hepatitis C test. Hepatitis B test. HIV (human immunodeficiency virus) test. STI (sexually transmitted infection) testing,  if you are at risk. Lung cancer screening. Colorectal cancer screening. Prostate cancer screening. Abdominal aortic aneurysm (AAA) screening. You may need this if you are a current or former smoker. Talk with your health care provider about your test results, treatment options, and if necessary, the need for more tests. Follow these instructions at home: Eating and drinking  Eat a diet that  includes fresh fruits and vegetables, whole grains, lean protein, and low-fat dairy products. Limit your intake of foods with high amounts of sugar, saturated fats, and salt. Take vitamin and mineral supplements as recommended by your health care provider. Do not drink alcohol if your health care provider tells you not to drink. If you drink alcohol: Limit how much you have to 0-2 drinks a day. Know how much alcohol is in your drink. In the [place] one drink equals one 12 oz bottle of beer (355 mL), one 5 oz glass of wine (148 mL), or one 1 oz glass of hard liquor (44 mL). Lifestyle Brush your teeth every morning and night with fluoride toothpaste. Floss one time each day. Exercise for at least 30 minutes 5 or more days each week. Do not use any products that contain nicotine or tobacco. These products include cigarettes, chewing tobacco, and vaping devices, such as e-cigarettes. If you need help quitting, ask your health care provider. Do not use drugs. If you are sexually active, practice safe sex. Use a condom or other form of protection to prevent STIs. Take aspirin only as told by your health care provider. Make sure that you understand how much to take and what form to take. Work with your health care provider to find out whether it is safe and beneficial for you to take aspirin daily. Ask your health care provider if you need to take a cholesterol-lowering medicine (statin). Find healthy ways to manage stress, such as: Meditation, yoga, or listening to music. Journaling. Talking to a trusted person. Spending time with friends and family. Safety Always wear your seat belt while driving or riding in a vehicle. Do not drive: If you have been drinking alcohol. Do not ride with someone who has been drinking. When you are tired or distracted. While texting. If you have been using any mind-altering substances or drugs. Wear a helmet and other protective equipment during sports  activities. If you have firearms in your house, make sure you follow all gun safety procedures. Minimize exposure to UV radiation to reduce your risk of skin cancer. What's next? Visit your health care provider once a year for an annual wellness visit. Ask your health care provider how often you should have your eyes and teeth checked. Stay up to date on all vaccines. This information is not intended to replace advice given to you by your health care provider. Make sure you discuss any questions you have with your health care provider. Document Revised: 09/07/2020 Document Reviewed: 09/07/2020 Elsevier Patient Education  2024 [employer].

## 2023-05-06 NOTE — Assessment & Plan Note (Signed)
Check lipids.  He is on simvastatin 40 mg daily and tolerating well.

## 2023-05-07 ENCOUNTER — Encounter: Payer: Self-pay | Admitting: Family Medicine

## 2023-05-07 LAB — LIPID PANEL
Cholesterol: 128 mg/dL (ref 0–200)
HDL: 66 mg/dL (ref >39.00)
LDL Cholesterol: 45 mg/dL (ref 0–99)
NonHDL: 62.47
Total CHOL/HDL Ratio: 2
Triglycerides: 85 mg/dL (ref 0.0–149.0)
VLDL: 17 mg/dL (ref 0.0–40.0)

## 2023-05-07 LAB — COMPREHENSIVE METABOLIC PANEL
ALT: 17 U/L (ref 0–53)
AST: 23 U/L (ref 0–37)
Albumin: 4.4 g/dL (ref 3.5–5.2)
Alkaline Phosphatase: 59 U/L (ref 39–117)
BUN: 16 mg/dL (ref 6–23)
CO2: 27 meq/L (ref 19–32)
Calcium: 9.2 mg/dL (ref 8.4–10.5)
Chloride: 106 meq/L (ref 96–112)
Creatinine, Ser: 0.88 mg/dL (ref 0.40–1.50)
GFR: 82.76 mL/min (ref >60.00)
Glucose, Bld: 96 mg/dL (ref 70–99)
Potassium: 4.5 meq/L (ref 3.5–5.1)
Sodium: 140 meq/L (ref 135–145)
Total Bilirubin: 0.6 mg/dL (ref 0.2–1.2)
Total Protein: 6.9 g/dL (ref 6.0–8.3)

## 2023-05-07 LAB — TSH: TSH: 0.88 u[IU]/mL (ref 0.35–5.50)

## 2023-05-07 LAB — CBC
HCT: 43.7 % (ref 39.0–52.0)
Hemoglobin: 15 g/dL (ref 13.0–17.0)
MCHC: 34.2 g/dL (ref 30.0–36.0)
MCV: 93.5 fL (ref 78.0–100.0)
Platelets: 178 10*3/uL (ref 150.0–400.0)
RBC: 4.68 Mil/uL (ref 4.22–5.81)
RDW: 12.9 % (ref 11.5–15.5)
WBC: 4.4 10*3/uL (ref 4.0–10.5)

## 2023-05-07 LAB — HEMOGLOBIN A1C: Hgb A1c MFr Bld: 5.4 % (ref 4.6–6.5)

## 2023-05-07 LAB — VITAMIN B12: Vitamin B-12: 505 pg/mL (ref 211–911)

## 2023-05-07 NOTE — Progress Notes (Signed)
Labs are all at goal.  He should keep up the great work.  He should continue to work on diet and exercise.  We can recheck again in a year.

## 2023-07-07 ENCOUNTER — Other Ambulatory Visit: Payer: Self-pay | Admitting: Family Medicine

## 2023-07-16 DIAGNOSIS — J209 Acute bronchitis, unspecified: Secondary | ICD-10-CM | POA: Diagnosis not present

## 2023-07-16 DIAGNOSIS — J329 Chronic sinusitis, unspecified: Secondary | ICD-10-CM | POA: Diagnosis not present

## 2023-08-09 DIAGNOSIS — H02831 Dermatochalasis of right upper eyelid: Secondary | ICD-10-CM | POA: Diagnosis not present

## 2023-08-09 DIAGNOSIS — H02834 Dermatochalasis of left upper eyelid: Secondary | ICD-10-CM | POA: Diagnosis not present

## 2023-09-04 ENCOUNTER — Encounter (HOSPITAL_BASED_OUTPATIENT_CLINIC_OR_DEPARTMENT_OTHER): Payer: Self-pay | Admitting: Pharmacist

## 2023-09-04 NOTE — Progress Notes (Signed)
 Pharmacy Quality Measure Review  This patient is appearing on a report for being at risk of failing the adherence measure for cholesterol (statin) medications this calendar year.   Medication: simvastatin   Last fill date: 04/05/2023 for 90 day supply   Per Dr Anson Basta simvastatin  was filled for 90 day supply on 08/30/2023 but has not been picked up yet. I called [facility] and they do still have simvastatin  Rx filled at the pharmacy.  Spoke with Mr. Ellender. He states he just received a message that Rx was ready and will pick up in the next few days.   Cecilie Coffee, PharmD Clinical Pharmacist [facility]  Population Health [phone]

## 2023-09-09 DIAGNOSIS — D1722 Benign lipomatous neoplasm of skin and subcutaneous tissue of left arm: Secondary | ICD-10-CM | POA: Diagnosis not present

## 2023-09-09 DIAGNOSIS — Z85828 Personal history of other malignant neoplasm of skin: Secondary | ICD-10-CM | POA: Diagnosis not present

## 2023-09-09 DIAGNOSIS — L821 Other seborrheic keratosis: Secondary | ICD-10-CM | POA: Diagnosis not present

## 2023-09-09 DIAGNOSIS — L570 Actinic keratosis: Secondary | ICD-10-CM | POA: Diagnosis not present

## 2023-11-29 ENCOUNTER — Other Ambulatory Visit: Payer: Self-pay | Admitting: Family Medicine

## 2023-11-30 ENCOUNTER — Other Ambulatory Visit: Payer: Self-pay | Admitting: Family Medicine

## 2023-12-23 DIAGNOSIS — Z008 Encounter for other general examination: Secondary | ICD-10-CM | POA: Diagnosis not present

## 2023-12-31 DIAGNOSIS — D485 Neoplasm of uncertain behavior of skin: Secondary | ICD-10-CM | POA: Diagnosis not present

## 2023-12-31 DIAGNOSIS — D2261 Melanocytic nevi of right upper limb, including shoulder: Secondary | ICD-10-CM | POA: Diagnosis not present

## 2023-12-31 DIAGNOSIS — D225 Melanocytic nevi of trunk: Secondary | ICD-10-CM | POA: Diagnosis not present

## 2023-12-31 DIAGNOSIS — Z85828 Personal history of other malignant neoplasm of skin: Secondary | ICD-10-CM | POA: Diagnosis not present

## 2023-12-31 DIAGNOSIS — L821 Other seborrheic keratosis: Secondary | ICD-10-CM | POA: Diagnosis not present

## 2023-12-31 DIAGNOSIS — D044 Carcinoma in situ of skin of scalp and neck: Secondary | ICD-10-CM | POA: Diagnosis not present

## 2023-12-31 DIAGNOSIS — L570 Actinic keratosis: Secondary | ICD-10-CM | POA: Diagnosis not present

## 2023-12-31 DIAGNOSIS — L738 Other specified follicular disorders: Secondary | ICD-10-CM | POA: Diagnosis not present

## 2024-02-23 ENCOUNTER — Other Ambulatory Visit: Payer: Self-pay | Admitting: Family Medicine

## 2024-04-07 ENCOUNTER — Ambulatory Visit: Payer: Medicare HMO

## 2024-04-28 ENCOUNTER — Ambulatory Visit

## 2024-05-07 ENCOUNTER — Encounter: Payer: Medicare HMO | Admitting: Family Medicine
